# Patient Record
Sex: Female | Born: 1950
Health system: Southern US, Community
[De-identification: ages and names within clinical notes are randomized; demographics above are authoritative.]

## PROBLEM LIST (undated history)

## (undated) DIAGNOSIS — R569 Unspecified convulsions: Secondary | ICD-10-CM

## (undated) DIAGNOSIS — F419 Anxiety disorder, unspecified: Secondary | ICD-10-CM

## (undated) DIAGNOSIS — G709 Myoneural disorder, unspecified: Secondary | ICD-10-CM

## (undated) DIAGNOSIS — I10 Essential (primary) hypertension: Secondary | ICD-10-CM

## (undated) DIAGNOSIS — J45909 Unspecified asthma, uncomplicated: Secondary | ICD-10-CM

## (undated) DIAGNOSIS — M1990 Unspecified osteoarthritis, unspecified site: Secondary | ICD-10-CM

## (undated) DIAGNOSIS — K219 Gastro-esophageal reflux disease without esophagitis: Secondary | ICD-10-CM

## (undated) DIAGNOSIS — F32A Depression, unspecified: Secondary | ICD-10-CM

## (undated) DIAGNOSIS — F329 Major depressive disorder, single episode, unspecified: Secondary | ICD-10-CM

## (undated) DIAGNOSIS — E785 Hyperlipidemia, unspecified: Secondary | ICD-10-CM

## (undated) DIAGNOSIS — T7840XA Allergy, unspecified, initial encounter: Secondary | ICD-10-CM

## (undated) DIAGNOSIS — H269 Unspecified cataract: Secondary | ICD-10-CM

## (undated) HISTORY — DX: Unspecified asthma, uncomplicated: J45.909

## (undated) HISTORY — DX: Unspecified osteoarthritis, unspecified site: M19.90

## (undated) HISTORY — DX: Depression, unspecified: F32.A

## (undated) HISTORY — DX: Major depressive disorder, single episode, unspecified: F32.9

## (undated) HISTORY — DX: Allergy, unspecified, initial encounter: T78.40XA

## (undated) HISTORY — PX: SALPINGOOPHORECTOMY: SHX82

## (undated) HISTORY — DX: Myoneural disorder, unspecified: G70.9

## (undated) HISTORY — DX: Unspecified convulsions: R56.9

## (undated) HISTORY — PX: TUBAL LIGATION: SHX77

## (undated) HISTORY — PX: CYSTECTOMY: SUR359

## (undated) HISTORY — DX: Unspecified cataract: H26.9

## (undated) HISTORY — PX: APPENDECTOMY: SHX54

## (undated) HISTORY — DX: Anxiety disorder, unspecified: F41.9

## (undated) HISTORY — DX: Essential (primary) hypertension: I10

## (undated) HISTORY — DX: Gastro-esophageal reflux disease without esophagitis: K21.9

## (undated) HISTORY — DX: Hyperlipidemia, unspecified: E78.5

---

## 1999-09-12 HISTORY — PX: FOOT SURGERY: SHX648

## 2003-05-27 ENCOUNTER — Ambulatory Visit (HOSPITAL_COMMUNITY): Admission: RE | Admit: 2003-05-27 | Discharge: 2003-05-27 | Payer: Self-pay | Admitting: Internal Medicine

## 2003-05-27 ENCOUNTER — Encounter: Payer: Self-pay | Admitting: Internal Medicine

## 2004-05-26 ENCOUNTER — Ambulatory Visit: Payer: Self-pay | Admitting: Internal Medicine

## 2004-06-03 ENCOUNTER — Ambulatory Visit: Payer: Self-pay | Admitting: Internal Medicine

## 2004-06-10 ENCOUNTER — Ambulatory Visit: Payer: Self-pay | Admitting: Internal Medicine

## 2004-06-16 ENCOUNTER — Ambulatory Visit: Payer: Self-pay | Admitting: *Deleted

## 2004-06-27 ENCOUNTER — Ambulatory Visit: Payer: Self-pay | Admitting: Internal Medicine

## 2004-07-25 ENCOUNTER — Ambulatory Visit: Payer: Self-pay | Admitting: Internal Medicine

## 2004-07-26 ENCOUNTER — Ambulatory Visit: Payer: Self-pay | Admitting: Obstetrics and Gynecology

## 2004-08-29 ENCOUNTER — Ambulatory Visit: Payer: Self-pay | Admitting: Internal Medicine

## 2004-11-29 ENCOUNTER — Ambulatory Visit: Payer: Self-pay | Admitting: Internal Medicine

## 2005-01-18 ENCOUNTER — Emergency Department (HOSPITAL_COMMUNITY): Admission: EM | Admit: 2005-01-18 | Discharge: 2005-01-18 | Payer: Self-pay | Admitting: Emergency Medicine

## 2005-02-01 ENCOUNTER — Inpatient Hospital Stay (HOSPITAL_COMMUNITY): Admission: AD | Admit: 2005-02-01 | Discharge: 2005-02-13 | Payer: Self-pay | Admitting: Psychiatry

## 2005-02-01 ENCOUNTER — Ambulatory Visit: Payer: Self-pay | Admitting: Psychiatry

## 2005-02-24 ENCOUNTER — Emergency Department (HOSPITAL_COMMUNITY): Admission: EM | Admit: 2005-02-24 | Discharge: 2005-02-25 | Payer: Self-pay | Admitting: Emergency Medicine

## 2005-06-07 ENCOUNTER — Encounter (HOSPITAL_BASED_OUTPATIENT_CLINIC_OR_DEPARTMENT_OTHER): Admission: RE | Admit: 2005-06-07 | Discharge: 2005-06-28 | Payer: Self-pay | Admitting: Surgery

## 2005-06-12 ENCOUNTER — Emergency Department (HOSPITAL_COMMUNITY): Admission: EM | Admit: 2005-06-12 | Discharge: 2005-06-12 | Payer: Self-pay | Admitting: Emergency Medicine

## 2009-02-22 ENCOUNTER — Ambulatory Visit (HOSPITAL_COMMUNITY): Admission: RE | Admit: 2009-02-22 | Discharge: 2009-02-22 | Payer: Self-pay | Admitting: Internal Medicine

## 2009-03-17 ENCOUNTER — Ambulatory Visit: Payer: Self-pay | Admitting: Obstetrics and Gynecology

## 2009-03-17 ENCOUNTER — Encounter: Payer: Self-pay | Admitting: Obstetrics and Gynecology

## 2010-06-17 ENCOUNTER — Ambulatory Visit (HOSPITAL_COMMUNITY): Admission: RE | Admit: 2010-06-17 | Discharge: 2010-06-17 | Payer: Self-pay | Admitting: Internal Medicine

## 2011-01-24 NOTE — Group Therapy Note (Signed)
NAME:  REALITY, DEJONGE NO.:  [id number]   MEDICAL RECORD NO.:  [id number]          PATIENT TYPE:  WOC   LOCATION:  [facility]                   FACILITY:  [facility]   PHYSICIAN:  Argentina Donovan, MD        DATE OF BIRTH:  09/30/1950   DATE OF SERVICE:                                  CLINIC NOTE   HISTORY OF PRESENT ILLNESS:  The patient is a 60 year old African  American female gravida 3, para 2-0-1-2 who is a diabetic type 2 on  metformin, Lantus, also takes the gabapentin, Cogentin and Prolixin.  She takes nothing for blood pressure.  However, when seen today, her  blood pressure was 151/97.  She is in for her annual gynecological  examination.   ALLERGIES:  The patient also states that today she took some ibuprofen  for her headache, and she has developed an enormous, large 4 to 5-cm  erythematous urticarial reaction on both lower extremities.  She is  allergic also to ASPIRIN AND CHOCOLATE.   REVIEW OF SYSTEMS:  Her review of systems is fairly negative with the  exception of the above.   PHYSICAL EXAMINATION:  GENERAL:  A well-developed, well-nourished  African American female in no acute distress.  HEENT:  Within normal limits.  VITAL SIGNS:  Blood pressure 151/97, weight 162, height 5 feet, 7 inches  tall, pulse 87 per minute.  NECK:  Supple.  Thyroid symmetrical, no masses.  Neck is erect.  LUNGS:  Clear to auscultation and percussion.  HEART:  No murmur.  Normal sinus rhythm.  BREASTS:  Pendulous, symmetrical, no dominant masses.  No nipple  discharge.  No supraclavicular or axillary nodes, i.e., normal mammogram  in June.  ABDOMEN:  Soft, flat, nontender.  No masses or organomegaly.  EXTREMITIES:  There are large erythematous urticarial lesions over the  entire areas of both legs.  No varicosities.  No edema.  NEUROLOGIC:  DTRs within normal limits.  GENITALIA:  External is normal.  BUS within normal limits.  Vagina is  clean with loss of some rugation.   Cervix is clean, parous.  Uterus is  slightly enlarged and deviated to the right but normal in consistency.  Adnexa could not be palpated because of the habitus of the patient.  ABDOMEN:  Soft, flat, nontender.  No masses or organomegaly.  A large  midline subumbilical vertical scar from previous appendectomy and  ovarian cystectomy and left oophorectomy.   IMPRESSION:  1. Normal gynecological examination.  2. Allergic skin rash.  3. Hypertension.   PLAN:  The plan is to refer the patient back to her internist whom she  has got an appointment with next month, start her on hydrochlorothiazide  25 mg a day and to give her a Medrol Dosepak for her rash.           ______________________________  Argentina Donovan, MD     PR/MEDQ  D:  03/17/2009  T:  03/17/2009  Job:  [id number]

## 2011-01-27 NOTE — Consult Note (Signed)
[name], [name] NO.:  [id number]   MEDICAL RECORD NO.:  [id number]          PATIENT TYPE:  EMS   LOCATION:  [facility]                         FACILITY:  [facility]   PHYSICIAN:  Harold A. Tanda Rockers, M.D.DATE OF BIRTH:  20-Jan-1951   DATE OF CONSULTATION:  06/19/2005  DATE OF DISCHARGE:  06/12/2005                                   CONSULTATION   REASON FOR CONSULTATION:  [name] is a 60 year old lady referred by Dr.  Fleet Contras for evaluation of multiple recurrent right arm and shoulder  wounds.   IMPRESSION:  Resolved cellulitis during the interim of this referral from  Dr. Concepcion Elk. Most likely cellulitis responded to the patient's p.o. Keflex.   SUBJECTIVE:  [name] is a 60 year old lady who has recently relocated  to this area from [place]. She has the previous medical history of  diabetes as well as schizophrenia for which she was on medication. When  initially seen by Dr. Concepcion Elk, her medications included Cogentin 1 mg daily,  Prolixin 20 mg injection once a week. Her random blood glucose was 99 mg  percent with a hemoglobin A1c of 6.9. Her physical exam during her initial  visit disclosed chronic rash and bumps over the right shoulder of  questionable etiology. The patient was started on Keflex and was referred to  the wound center.   PAST MEDICAL HISTORY:  As per above. She specifically denies previous  surgical procedures.   FAMILY HISTORY:  Positive for high blood pressure, diabetes, and heart  attack.   SOCIAL HISTORY:  She is single. She does smoke a pack and half of cigarettes  a day. She has been on multiple hospital admissions of a psychiatric nature.  We did not go into extreme detail except to establish the fact that she had  been under psychiatric care in the past.   PHYSICAL EXAMINATION:  Physical exam which was chaperoned completely by  Darla Lesches,  R.N., was as follows:  HEENT:  Exam was clear.  NECK:  Supple. Tracheal  midline.  ABDOMEN:  Soft without aneurysmal dilatation.  EXTREMITIES:  Pedal pulses were 3+ bilaterally. There were no ulcerations  demonstrable.  SKIN:  There were multiple excoriated areas on the right shoulder which were  completely healed. There were hypopigmented areas as well as some  hyperpigmented areas but there were no open wounds and no drainage. These  wounds were uniquely located over the right shoulder. There were no such  wounds on the back or the posterior thighs or lower extremities. There was  no associated adenopathy.   DISCUSSION:  The patient was advised that at this point, she has apparently  responded to the p.o. antibiotics that Dr. Concepcion Elk prescribed for her at the  time that this consultation was made. During the interim, she has responded  and we are happy to conclude that the presumed cellulitis has completely  resolved. We have encouraged her to keep all of her appointments with Dr.  Concepcion Elk. In addition, she is to keep her appointments with her assigned  psychiatrist. We have not given her a reappointment for  the wound center,  but we have expressed a willingness to see her in the event that she has  open wounds. The patient expresses gratitude for having been seen in the  wound center and expresses a desire to comply with our recommendations.           ______________________________  Theresia Majors Tanda Rockers, M.D.     Cephus Slater  D:  06/19/2005  T:  06/19/2005  Job:  [id number]   cc:   Fleet Contras, M.D.  Fax: [phone]

## 2011-01-27 NOTE — Consult Note (Signed)
NAMENADEAN, MONTANARO NO.:  [id number]   MEDICAL RECORD NO.:  [id number]          PATIENT TYPE:  IPS   LOCATION:  0406                          [facility]   PHYSICIAN:  Brooke Quarry, MD      DATE OF BIRTH:  19-Nov-1950   DATE OF CONSULTATION:  DATE OF DISCHARGE:                                   CONSULTATION   IDENTIFYING INFORMATION:  [name] is a 60 year old lady who was  admitted to [facility] on Feb 02, 2005 at which time apparently she  was committed because of delusional thinking, hearing voices and hostile  behavior.  I gather that she is thought to have schizoaffective disorder.  It is essentially impossible at this time to get any medical history from  [name].  She states that her medical history is a secret that she keeps  hidden and does not discuss with anyone including her family.  She says  that, in the past, doctors have tried to get information from her but she  will not give it to them because it is a Event organiser.  She says that she has had  no medical problems in the past and that no one has ever found her blood  sugar to be abnormal previously.  I do not believe that there has been any  previous admissions at this facility.  I note that the patient was seen in  the emergency room on May 10th but I do not believe that any laboratory  studies were done at that time.  The patient denies any recent history of  weight loss, excessive thirst, urinary frequency, paresthesias or numbness  of the feet or visual impairment.   PAST MEDICAL HISTORY:  The patient has had some type of a biopsy procedure  on her chest.  Perhaps, this was for an abscess.  She says that she cannot  discuss this.  She denies having any other operations.  She denies any other  hospitalizations.   FAMILY HISTORY:  She says that her family's history is a Event organiser.   SOCIAL HISTORY:  Cannot be obtained at this time.   REVIEW OF SYSTEMS:  Fairly fruitless.  She  indicates that she is not having  any headache, breathing difficulty or chest pain.  I was not able to really  obtain any other information from her.   PHYSICAL EXAMINATION:  HEENT/NECK:  This was also rather sketchy but her  head and neck exam appears to be normal.  CHEST:  On examination of the chest, there is a scar on the anterior portion  of the chest that may be from a previous incision and drainage procedure.  The chest is clear.  CARDIOVASCULAR:  Normal S1 and S2 without rubs, murmurs or gallops.  ABDOMEN:  The patient will not allow me to examine her abdomen.  EXTREMITIES:  We examined her extremities together.  She has no cyanosis or  edema.   IMPRESSION:  1.  Inadequate database.  2.  Uncontrolled diabetes.  CBG shows blood sugars ranging between 170 and      305.  3.  Possible incision and drainage or skin biopsy procedure.  4.  Schizoaffective disorder.   PLAN:  1.  I would continue to monitor the patient's blood sugars a.c. and h.s.  2.  I will order a sliding scale insulin regimen.  3.  In the morning, I think it would be a good idea to start the patient on      an oral agent.  I would suggest that Glucophage would be a good choice      as it should be very safe.  It is hard to say whether the patient will      be compliant with any type of regimen as an outpatient.  4.  We will follow this patient in the hospital setting.      SY/MEDQ  D:  02/09/2005  T:  02/09/2005  Job:  [id number]

## 2011-01-27 NOTE — Discharge Summary (Signed)
[name], [name] NO.:  [id number]   MEDICAL RECORD NO.:  [id number]          PATIENT TYPE:  IPS   LOCATION:  0406                          [facility]ACILITY[facility]   PHYSICIAN:  Geoffery Lyons, M.D.      DATE OF BIRTH:  1951/01/27   DATE OF ADMISSION:  02/01/2005  DATE OF DISCHARGE:  02/13/2005                                 DISCHARGE SUMMARY   IDENTIFYING DATA:  The patient is here on petition.  The papers state that  the patient is delusional, hearing command voices.  The patient presents and  was hostile and defensive, preoccupied with the idea of bathing herself with  Lysol and Clorox and states also she tried to wash her grandchildren as  well.  The patient denies any psychotic symptoms or significant stressors  and denied any suicidal or homicidal thoughts.   PAST PSYCHIATRIC HISTORY:  First admission to the [facility],  has apparently seen Dr. Hortencia Pilar in the past.   SOCIAL HISTORY:  The patient is a 60 year old African-American female,  marital status is unknown.  The patient apparently lives alone but again  that is uncertain as the patient is not providing much history.  The patient  apparently is disabled.   FAMILY HISTORY:  Unclear.   ALCOHOL AND DRUG HISTORY:  Denies any alcohol or drug use.   PRIMARY CARE [name]:  The patient reports that it is confidential..   MEDICAL PROBLEMS:  Recent urinary tract infection.   MEDICATIONS:  1.  Bactrim 1 p.o. b.i.d. for three days.  2.  Apparently was on valproic acid 250 mg taking two b.i.d.   DRUG ALLERGIES:  ASPIRIN.   PHYSICAL EXAMINATION:  The patient appears in no acute distress.  VITALS:  Stable at 98.6, 73 heart rate, blood pressure is elevated, however,  at 186/100.  GENERAL:  She is a well-nourished female and in no acute distress.  EXTREMITIES:  No clubbing or edema.  The patient walks upright without any  tremors and at this time is not allowing any physical exam due to her  guarded and paranoid behavior.  This physical exam was attempted twice.   LABORATORY DATA:  Hemoglobin A1C 6.3, glucose 146, BUN 4, TSH 0.595.   MENTAL STATUS EXAM:  The patient is in the day room sitting in a chair.  Again, not wanting to talk, answered a few questions, little eye contact.  Speech is clear with normal pace and tone.  The patient states she feels  just fine today.  The patient is irritable.  Thought processes are  delusional and paranoid.  Does not appear to be internally distracted by  internal stimuli.  Cognitive function intact.  Seems aware of her  surroundings and herself.  Her memory is poor.  She is a poor historian.  Judgment and insight is poor.   DIAGNOSES:  Axis I.  Rule out schizoaffective disorder.  Axis II.  Deferred.  Axis III.  Hypertension.  Axis IV.  Other psychosocial problems, possible medical problems.  Axis V.  Current is 25, estimated past year 60.   PLAN:  Admit and monitor for psychotic symptoms.  Contract for safety.  Stabilize mood and thinking.  We will monitor the patient's vital signs  closely as well as her elevated blood sugar.  We will initiate Risperdal for  paranoid ideation.  Contact family for background information.  The patient  is to be medication compliant.  Tentative length of stay is 5-7 days.       JO/MEDQ  D:  02/14/2005  T:  02/14/2005  Job:  [id number]

## 2011-01-27 NOTE — Discharge Summary (Signed)
[name], [name] NO.:  [id number]   MEDICAL RECORD NO.:  [id number]          PATIENT TYPE:  IPS   LOCATION:  0406                          [facility]ILITY[facility]   PHYSICIAN:  [name], M.D.      DATE OF BIRTH:  10-27-1950   DATE OF ADMISSION:  02/01/2005  DATE OF DISCHARGE:  02/13/2005                                 DISCHARGE SUMMARY   CHIEF COMPLAINT AND PRESENT ILLNESS:  This was the first admission to [facility] for this 60 year old female admitted involuntarily.  She was delusional, hearing command voices, presented as hostile, defensive,  preoccupied with the idea of bathing herself with Lysol and Clorox.  Stated  that she tried to wash her grandchildren as well.  Denied any psychotic  symptoms or significant stressors.  Denied any suicidal or homicidal  thoughts.   PAST PSYCHIATRIC HISTORY:  First time at [facility].  Has seen Dr.  Hortencia Morrison in the past.   ALCOHOL/DRUG HISTORY:  Denies the use or abuse of any substances.   PAST MEDICAL HISTORY:  Recent urinary tract infection.   MEDICATIONS:  Bactrim 1 twice a day for three days.  Was on valproic acid  250 mg, 2 twice a day.  Noncompliant.   PHYSICAL EXAMINATION:  Performed and failed to show any acute findings.   LABORATORY DATA:  CBC within normal limits.  Blood chemistry with glucose  146, hemoglobin A1C 6.3. Lipid profile with cholesterol 157.  TSH 0.595.   MENTAL STATUS EXAM:  Female who was not wanting to talk, answer a few  questions, little eye contact.  Speech was clear, normal pace and tone.  Claimed that she felt just fine, irritable, still delusional ideas, some  paranoia, very reserved, very guarded, suspicious.  Cognition affected by  the acute psychotic process.   ADMISSION DIAGNOSES:   AXIS I:  Schizoaffective disorder.   AXIS II:  No diagnosis.   AXIS III:  1.  Hypertension.  2.  Rule out diabetes mellitus.   AXIS IV:  Moderate.   AXIS V:   Global Assessment of Functioning upon admission 25; highest Global  Assessment of Functioning in the last year 60.   HOSPITAL COURSE:  She was admitted.  She was started in individual and group  psychotherapy.  She was seen by internal medicine.  She was very resistant  to share any of her history with the internal medicine physician.  Denied  any medical problems.  Blood sugars were monitored.  She was placed on  Toprol XL 50 mg per day, Ativan 2 mg IM every six hours as needed for  agitation as well as Haldol 5 mg IM every six hours as needed for agitation.  She was eventually placed on Risperdal 0.25 mg in the morning, 0.5 mg at  night that she was not taking.  Risperdal was switched to Risperdal M-Tab  0.5 mg in the morning and 1 mg at night.  She continued to claim that she  did not need to be in the hospital.  Claimed that none of the allegations  were true.  Claimed that she was not taking medication because she did not  need them.  Claimed no hallucinations.  Claimed her mood was okay.  Claimed  that she was sleeping well.  Would not validate any of the information on  the petition.  Mood was upset because she was in the unit.  Affect was  constricted, suspicious, denied any of the symptoms.  We continued to work  with her.  Continued to refuse medications.  Claimed that she has been  healthy all along and she has not needed medication in the past and did not  need medications now.  She was somewhat irritable when pressured.  Continued  to refuse medication and stating that she did not need to be in the unit.  Second opinion was obtained and justification was there to force  medications.  She had been wearing __________.  She was not sleeping.  She  was cleaning tables with peanut butter.  Had gotten more agitated, so we  went ahead and forced the Risperdal Consta.  By June 2nd, she had started to  accept some medication by mouth.  She seemed to be more at ease, less  suspicious,  more compliant.  She was indeed more pleasant.  We continued to  stabilize and, on June 5th, the commitment expired.  There was no enough  evidence to keep her inpatient.  She had gotten the Risperdal Consta, was  accepting the Risperdal by mouth.  Objectively, she was better, so we went  ahead and discharged to outpatient follow-up.   DISCHARGE DIAGNOSES:   AXIS I:  Schizoaffective disorder.   AXIS II:  No diagnosis.   AXIS III:  1.  Diabetes mellitus.  2.  Arterial hypertension.   AXIS IV:  Moderate.   AXIS V:  Global Assessment of Functioning upon discharge 45-50.   DISCHARGE MEDICATIONS:  1.  Risperdal Consta 25 mg IM; next dose February 23, 2005.  2.  Risperdal 0.5 mg tab in the morning and 2 at bedtime.  3.  Toprol-XL 50 mg daily.  4.  Metformin 500 mg, 1 twice a day.  5.  Zocor 20 mg at night.  6.  Glucotrol 5 mg in the morning.   FOLLOW UP:  Dr. Lang Morrison at [facility].     IL/MEDQ  D:  03/09/2005  T:  03/10/2005  Job:  [id number]

## 2011-01-27 NOTE — Group Therapy Note (Signed)
NAME:  [name], [name] NO.:  [id number]   MEDICAL RECORD NO.:  [id number]          PATIENT TYPE:  WOC   LOCATION:  [facility]                   FACILITY:  [facility]   PHYSICIAN:  Argentina Donovan, MD        DATE OF BIRTH:  1951/01/08   DATE OF SERVICE:  07/26/2004                                    CLINIC NOTE   REASON FOR VISIT:  The patient is a 60 year old black female gravida 3, para  2-0-1-2 three years postmenopausal who is followed by the medical clinic and  is on many medications for hypertension, asthma, and diabetes.  She has a  long list of medications on the chart, is otherwise in good health.  She  takes medications regularly and is in for an annual Pap smear.  We have  discussed with her that if she has always had normal Pap smears that she  would probably only need one every 3 years at this point.   PHYSICAL EXAMINATION:  PELVIC:  External genitalia is normal.  BUS within  normal limits.  Vagina is clean and well rugated.  Cervix is clean and  parous.  The uterus and adnexa were not well outlined because of the habitus  of the patient, 224 pounds.  Pap smear was taken.   IMPRESSION:  Normal gynecological exam.      PR/MEDQ  D:  07/26/2004  T:  07/26/2004  Job:  [id number]

## 2011-05-23 ENCOUNTER — Other Ambulatory Visit (HOSPITAL_COMMUNITY): Payer: Self-pay | Admitting: Internal Medicine

## 2011-05-23 DIAGNOSIS — Z1231 Encounter for screening mammogram for malignant neoplasm of breast: Secondary | ICD-10-CM

## 2011-07-31 ENCOUNTER — Ambulatory Visit (HOSPITAL_COMMUNITY): Payer: Self-pay

## 2011-08-09 ENCOUNTER — Ambulatory Visit (HOSPITAL_COMMUNITY)
Admission: RE | Admit: 2011-08-09 | Discharge: 2011-08-09 | Disposition: A | Discharge status: Home / Self Care | Payer: Medicare PPO | Source: Ambulatory Visit | Attending: Internal Medicine | Admitting: Internal Medicine

## 2011-08-09 DIAGNOSIS — Z1231 Encounter for screening mammogram for malignant neoplasm of breast: Secondary | ICD-10-CM | POA: Insufficient documentation

## 2012-05-03 ENCOUNTER — Ambulatory Visit (INDEPENDENT_AMBULATORY_CARE_PROVIDER_SITE_OTHER): Payer: Medicare Other | Admitting: Obstetrics & Gynecology

## 2012-05-03 ENCOUNTER — Other Ambulatory Visit (HOSPITAL_COMMUNITY)
Admission: RE | Admit: 2012-05-03 | Discharge: 2012-05-03 | Disposition: A | Discharge status: Home / Self Care | Payer: Medicare Other | Source: Ambulatory Visit | Attending: Obstetrics & Gynecology | Admitting: Obstetrics & Gynecology

## 2012-05-03 ENCOUNTER — Encounter: Payer: Self-pay | Admitting: Obstetrics & Gynecology

## 2012-05-03 VITALS — BP 111/82 | HR 85 | Temp 98.1°F | Resp 12 | Ht 67.0 in | Wt 189.8 lb

## 2012-05-03 DIAGNOSIS — G8929 Other chronic pain: Secondary | ICD-10-CM

## 2012-05-03 DIAGNOSIS — M129 Arthropathy, unspecified: Secondary | ICD-10-CM

## 2012-05-03 DIAGNOSIS — Z124 Encounter for screening for malignant neoplasm of cervix: Secondary | ICD-10-CM | POA: Insufficient documentation

## 2012-05-03 DIAGNOSIS — Z01419 Encounter for gynecological examination (general) (routine) without abnormal findings: Secondary | ICD-10-CM

## 2012-05-03 DIAGNOSIS — Z1151 Encounter for screening for human papillomavirus (HPV): Secondary | ICD-10-CM | POA: Insufficient documentation

## 2012-05-03 DIAGNOSIS — M1990 Unspecified osteoarthritis, unspecified site: Secondary | ICD-10-CM | POA: Insufficient documentation

## 2012-05-03 DIAGNOSIS — F419 Anxiety disorder, unspecified: Secondary | ICD-10-CM | POA: Insufficient documentation

## 2012-05-03 DIAGNOSIS — E119 Type 2 diabetes mellitus without complications: Secondary | ICD-10-CM

## 2012-05-03 DIAGNOSIS — F411 Generalized anxiety disorder: Secondary | ICD-10-CM

## 2012-05-03 DIAGNOSIS — I10 Essential (primary) hypertension: Secondary | ICD-10-CM

## 2012-05-03 NOTE — Progress Notes (Signed)
Patient ID: [name], female   DOB: 1951-05-25, 61 y.o.   MRN: [id number]  Chief Complaint  Patient presents with  . Gynecologic Exam    HPI [id number] No LMP recorded. Patient is postmenopausal.  [name] is a 61 y.o. female.  Referred for pap smear by [facility] in [facility]. No history of abnormal pap, last was done 2010. HPI  Past Medical History  Diagnosis Date  . Asthma   . Hypertension   . Arthritis   . Diabetes mellitus     type II  . Allergy   . Depression   . Hyperlipidemia   . Seizures     Past Surgical History  Procedure Date  . Foot surgery   . Salpingoophorectomy   . Tubal ligation   . Cystectomy   . Appendectomy     Family History  Problem Relation Age of Onset  . Heart disease Mother   . Diabetes Mother   . Depression Mother   . Cancer Father   . Diabetes Father   . Depression Father   . Diabetes Sister   . Kidney disease Brother   . Diabetes Brother   . Kidney disease Brother   . Diabetes Brother   . Diabetes Brother   . Diabetes Brother     Social History History  Substance Use Topics  . Smoking status: Current Everyday Smoker -- 0.5 packs/day  . Smokeless tobacco: Never Used  . Alcohol Use: No    Allergies  Allergen Reactions  . Aspirin Anaphylaxis    Swelling in mouth, throat and hives    Current Outpatient Prescriptions  Medication Sig Dispense Refill  . amLODipine (NORVASC) 5 MG tablet Take 5 mg by mouth daily.      . benztropine (COGENTIN) 0.5 MG tablet Take 0.5 mg by mouth daily.      . diazepam (VALIUM) 5 MG tablet Take 5 mg by mouth every 12 (twelve) hours as needed.      . fenofibrate (TRICOR) 48 MG tablet Take 48 mg by mouth daily.      Marland Kitchen gabapentin (NEURONTIN) 300 MG capsule Take 300 mg by mouth 3 (three) times daily.      . insulin glargine (LANTUS) 100 UNIT/ML injection Inject 35 Units into the skin at bedtime.      Marland Kitchen lisinopril (PRINIVIL,ZESTRIL) 10 MG tablet Take 10 mg by mouth daily.      Marland Kitchen  loratadine (CLARITIN) 10 MG tablet Take 10 mg by mouth daily.      . metFORMIN (GLUCOPHAGE) 500 MG tablet Take 500 mg by mouth 2 (two) times daily with a meal.      . polyethylene glycol powder (GLYCOLAX/MIRALAX) powder Take 17 g by mouth daily.      . fluPHENAZine decanoate (PROLIXIN) 25 MG/ML injection       . lisinopril-hydrochlorothiazide (PRINZIDE,ZESTORETIC) 20-25 MG per tablet       . nystatin (MYCOSTATIN) 100000 UNIT/ML suspension       . PROAIR HFA 108 (90 BASE) MCG/ACT inhaler       . promethazine-codeine (PHENERGAN WITH CODEINE) 6.25-10 MG/5ML syrup         Review of Systems Review of Systems  Constitutional: Negative.   Gastrointestinal: Negative.   Genitourinary: Negative for vaginal discharge, menstrual problem and pelvic pain.    Blood pressure 111/82, pulse 85, temperature 98.1 F (36.7 C), temperature source Oral, resp. rate 12, height 5\' 7"  (1.702 m), weight 189 lb 12.8 oz (86.093 kg).  Physical Exam  Physical Exam  Constitutional: She appears well-developed. No distress.  Pulmonary/Chest: Effort normal.  Abdominal: Soft. She exhibits no distension and no mass.  Genitourinary: Vagina normal and uterus normal. No vaginal discharge found.       No masses or tenderness   Pap smear done Data Reviewed Referral notes  Assessment    Routine GYN exam for pap smear. Mammography and breast exams through her PCP. Co-testing for HPV done, if normal, consider D/C future paps.    Plan    Check result.       ARNOLD,JAMES 05/03/2012, 12:52 PM

## 2012-05-03 NOTE — Patient Instructions (Addendum)
Pap Test A Pap test is a sampling of cells from a woman's cervix. The cervix is the opening between the vagina (birth canal) and the uterus (the bottom part of the womb). The cells are scraped from the cervix during a pelvic exam. These cells are then looked at under a microscope to see if the cells are normal or to see if a cancer is developing or there are changes that suggest a cancer will develop. Cervical dysplasia is a condition in which a woman has abnormal changes in the top layer of cells of her cervix. These changes are an early sign that cervical cancer may develop. Pap tests also look for the human papilloma virus (HPV) because it has 4 types that are responsible for 70% of cervical cancer. Infections can also be found during a Pap test such as bacteria, fungus, protozoa and viruses.  Cervical cancer is harder to treat and less likely to have a good outcome if left untreated. Catching the disease at an early stage leads to a better outcome. Since the Pap test was introduced 60 years ago, deaths from cervical cancer have decreased by 70%. Every woman should keep up to date with Pap tests. RISK FACTORS FOR CERVICAL CANCER INCLUDE:   Becoming sexually active before age 18.   Being the daughter of a woman who took diethylstilbestrol (DES) during pregnancy.   Having a sexual partner who has or has had cancer of the penis.   Having a sexual partner whose past partner had cervical cancer or cervical dysplasia (early cell changes which suggest a cancer may develop).   Having a weakened immune system. An example would be HIV or other immunodeficiency disorder.   Having had a sexually transmitted infection such as chlamydia, gonorrhea or HPV.   Having had an abnormal Pap or cancer of the vagina or vulva.   Having had more than one sexual partner.   A history of cervical cancer in a woman's sister or mother.   Not using condoms with new sexual partners.   Smoking.  WHO SHOULD HAVE PAP  TESTS  A Pap test is done to screen for cervical cancer.   The first Pap test should be done at age 21.   Between ages 21 and 29, Pap tests are repeated every 2 years.   Beginning at age 30, you are advised to have a Pap test every 3 years as long as your past 3 Pap tests have been normal.   Some women have medical problems that increase the chance of getting cervical cancer. Talk to your caregiver about these problems. It is especially important to talk to your caregiver if a new problem develops soon after your last Pap test. In these cases, your caregiver may recommend more frequent screening and Pap tests.   The above recommendations are the same for women who have or have not gotten the vaccine for HPV (Human Papillomavirus).   If you had a hysterectomy for a problem that was not a cancer or a condition that could lead to cancer, then you no longer need Pap tests. However, even if you no longer need a Pap test, a regular exam is a good idea to make sure no other problems are starting.    If you are between ages 65 and 70, and you have had normal Pap tests going back 10 years, you no longer need Pap tests. However, even if you no longer need a Pap test, a regular exam is a good idea   to make sure no other problems are starting.    If you have had past treatment for cervical cancer or a condition that could lead to cancer, you need Pap tests and screening for cancer for at least 20 years after your treatment.   If Pap tests have been discontinued, risk factors (such as a new sexual partner) need to be re-assessed to determine if screening should be resumed.   Some women may need screenings more often if they are at high risk for cervical cancer.  PREPARATION FOR A PAP TEST A Pap test should be performed during the weeks before the start of menstruation. Women should not douche or have sexual intercourse for 24 hours before the test. No vaginal creams, diaphragms, or tampons should be  used for 24 hours before the test. To minimize discomfort, a woman should empty her bladder just before the exam. TAKING THE PAP TEST The caregiver will perform a pelvic exam. A metal or plastic instrument (speculum) is placed in the vagina. This is done before your caregiver does a bimanual exam of your internal female organs. This instrument allows your caregiver to see the inside of the vagina and look at the cervix. A small, sterile brush is used to take a sample of cells from the internal opening of the cervix. A small wooden spatula is used to scrape the outside of the cervix. Neither of these two methods to collect cells will cause you pain. These two scrapings are placed on a glass slide or in a small bottle filled with a special liquid. The cells are looked at later under a microscope in a lab. A specialist will look at these cells and determine if the cells are normal. RESULTS OF YOUR PAP TEST  A healthy Pap test shows no abnormal cells or evidence of inflammation.   The presence of abnormally growing cells on the surface of the cervix may be reported as an abnormal Pap test. Different categories of findings are used to describe your Pap test. Your caregiver will go over the importance of these findings with you. The caregiver will then determine what follow-up is needed or when you should have your next pap test.   If you have had two or more abnormal Pap tests:   You may be asked to have a colposcopy. This is a test in which the cervix is viewed with a special lighted microscope.   A cervical tissue sample (biopsy) may also be needed. This involves taking a small tissue sample from the cervix. The sample is looked at under a microscope to find the cause of the abnormal cells. Make sure you find out the results of the Pap test. If you have not received the results within two weeks, contact your caregiver's office for the results. Do not assume everything is normal if you have not heard from  your caregiver or medical facility. It is important to follow up on all of your test results.  Document Released: 11/18/2002 Document Revised: 08/17/2011 Document Reviewed: 08/22/2011 [employer] Patient Information 2012 [employer]. 

## 2012-07-03 ENCOUNTER — Other Ambulatory Visit (HOSPITAL_COMMUNITY): Payer: Self-pay | Admitting: Specialist

## 2012-07-03 DIAGNOSIS — Z1231 Encounter for screening mammogram for malignant neoplasm of breast: Secondary | ICD-10-CM

## 2012-08-12 ENCOUNTER — Ambulatory Visit (HOSPITAL_COMMUNITY): Payer: Medicare Other

## 2012-08-14 ENCOUNTER — Ambulatory Visit (HOSPITAL_COMMUNITY)
Admission: RE | Admit: 2012-08-14 | Discharge: 2012-08-14 | Disposition: A | Discharge status: Home / Self Care | Payer: Medicare PPO | Source: Ambulatory Visit | Attending: Specialist | Admitting: Specialist

## 2012-08-14 DIAGNOSIS — Z1231 Encounter for screening mammogram for malignant neoplasm of breast: Secondary | ICD-10-CM | POA: Insufficient documentation

## 2013-07-18 ENCOUNTER — Ambulatory Visit (INDEPENDENT_AMBULATORY_CARE_PROVIDER_SITE_OTHER): Payer: Medicare PPO

## 2013-07-18 VITALS — BP 148/91 | HR 81 | Resp 18 | Ht 67.0 in | Wt 192.0 lb

## 2013-07-18 DIAGNOSIS — E1149 Type 2 diabetes mellitus with other diabetic neurological complication: Secondary | ICD-10-CM

## 2013-07-18 DIAGNOSIS — E1140 Type 2 diabetes mellitus with diabetic neuropathy, unspecified: Secondary | ICD-10-CM

## 2013-07-18 DIAGNOSIS — Q828 Other specified congenital malformations of skin: Secondary | ICD-10-CM

## 2013-07-18 DIAGNOSIS — M2040 Other hammer toe(s) (acquired), unspecified foot: Secondary | ICD-10-CM

## 2013-07-18 DIAGNOSIS — M79609 Pain in unspecified limb: Secondary | ICD-10-CM

## 2013-07-18 DIAGNOSIS — E1142 Type 2 diabetes mellitus with diabetic polyneuropathy: Secondary | ICD-10-CM

## 2013-07-18 NOTE — Patient Instructions (Signed)
Diabetes and Foot Care Diabetes may cause you to have problems because of poor blood supply (circulation) to your feet and legs. This may cause the skin on your feet to become thinner, break easier, and heal more slowly. Your skin may become dry, and the skin may peel and crack. You may also have nerve damage in your legs and feet causing decreased feeling in them. You may not notice minor injuries to your feet that could lead to infections or more serious problems. Taking care of your feet is one of the most important things you can do for yourself.  HOME CARE INSTRUCTIONS  Wear shoes at all times, even in the house. Do not go barefoot. Bare feet are easily injured.  Check your feet daily for blisters, cuts, and redness. If you cannot see the bottom of your feet, use a mirror or ask someone for help.  Wash your feet with warm water (do not use hot water) and mild soap. Then pat your feet and the areas between your toes until they are completely dry. Do not soak your feet as this can dry your skin.  Apply a moisturizing lotion or petroleum jelly (that does not contain alcohol and is unscented) to the skin on your feet and to dry, brittle toenails. Do not apply lotion between your toes.  Trim your toenails straight across. Do not dig under them or around the cuticle. File the edges of your nails with an emery board or nail file.  Do not cut corns or calluses or try to remove them with medicine.  Wear clean socks or stockings every day. Make sure they are not too tight. Do not wear knee-high stockings since they may decrease blood flow to your legs.  Wear shoes that fit properly and have enough cushioning. To break in new shoes, wear them for just a few hours a day. This prevents you from injuring your feet. Always look in your shoes before you put them on to be sure there are no objects inside.  Do not cross your legs. This may decrease the blood flow to your feet.  If you find a minor scrape,  cut, or break in the skin on your feet, keep it and the skin around it clean and dry. These areas may be cleansed with mild soap and water. Do not cleanse the area with peroxide, alcohol, or iodine.  When you remove an adhesive bandage, be sure not to damage the skin around it.  If you have a wound, look at it several times a day to make sure it is healing.  Do not use heating pads or hot water bottles. They may burn your skin. If you have lost feeling in your feet or legs, you may not know it is happening until it is too late.  Make sure your health care provider performs a complete foot exam at least annually or more often if you have foot problems. Report any cuts, sores, or bruises to your health care provider immediately. SEEK MEDICAL CARE IF:   You have an injury that is not healing.  You have cuts or breaks in the skin.  You have an ingrown nail.  You notice redness on your legs or feet.  You feel burning or tingling in your legs or feet.  You have pain or cramps in your legs and feet.  Your legs or feet are numb.  Your feet always feel cold. SEEK IMMEDIATE MEDICAL CARE IF:   There is increasing redness,   swelling, or pain in or around a wound.  There is a red line that goes up your leg.  Pus is coming from a wound.  You develop a fever or as directed by your health care provider.  You notice a bad smell coming from an ulcer or wound. Document Released: 08/25/2000 Document Revised: 04/30/2013 Document Reviewed: 02/04/2013 [employer] Patient Information 2014 [employer].  

## 2013-07-18 NOTE — Progress Notes (Signed)
  Subjective:    Patient ID: [name], female    DOB: June 07, 1951, 62 y.o.   MRN: [id number]  HPIN Diabetic shoe pick up No changes medication her health history since last seen. Patient indicates she is unsteady with her walking and fell injuring her left knee recently, has a slight scab on her knee.          Review of Systems  Respiratory: Positive for wheezing.   Cardiovascular: Negative.   Gastrointestinal: Negative.   Endocrine: Negative.   Genitourinary: Negative.   Musculoskeletal: Negative.   Skin: Positive for wound.       Scabbing to arms and left knee  Allergic/Immunologic: Positive for food allergies.       Chocolate  Psychiatric/Behavioral: Negative.        Objective:   Physical Exam Neurovascular status unchanged pedal pulses DP plus one over 4 PT thready nonpalpable bilateral. Epicritic and proprioceptive sensations grossly diminished on Semmes Weinstein testing. Patient also complaining of pain of her ankle and leg however this will be evaluated within the next month during followup visit. At this time one pair of shoes and 3 pairs of dual density Plastizote inlays which fit and contour well to the patient's foot and arch are dispensed. The shoes for adequate room for toes patient does have digital contractures multiple keratoses which are identified. Patient will recheck in one month for followup she check and palliative diabetic foot and nail care.       Assessment & Plan:  Diabetes with peripheral neuropathy. 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 Dispensed diabetic shoes and 3 pairs of dual density Plastizote inlays. Dispensed oral and written instructions for shoe wear and break-in period plan for followup with in one month.  Alvan Dame DPM

## 2013-07-26 ENCOUNTER — Emergency Department (HOSPITAL_COMMUNITY)
Admission: EM | Admit: 2013-07-26 | Discharge: 2013-07-26 | Disposition: A | Discharge status: Home / Self Care | Payer: Medicare PPO | Attending: Emergency Medicine | Admitting: Emergency Medicine

## 2013-07-26 ENCOUNTER — Encounter (HOSPITAL_COMMUNITY): Payer: Self-pay | Admitting: Emergency Medicine

## 2013-07-26 DIAGNOSIS — E785 Hyperlipidemia, unspecified: Secondary | ICD-10-CM | POA: Insufficient documentation

## 2013-07-26 DIAGNOSIS — Z794 Long term (current) use of insulin: Secondary | ICD-10-CM | POA: Insufficient documentation

## 2013-07-26 DIAGNOSIS — F3289 Other specified depressive episodes: Secondary | ICD-10-CM | POA: Insufficient documentation

## 2013-07-26 DIAGNOSIS — F329 Major depressive disorder, single episode, unspecified: Secondary | ICD-10-CM | POA: Insufficient documentation

## 2013-07-26 DIAGNOSIS — R739 Hyperglycemia, unspecified: Secondary | ICD-10-CM

## 2013-07-26 DIAGNOSIS — E119 Type 2 diabetes mellitus without complications: Secondary | ICD-10-CM | POA: Insufficient documentation

## 2013-07-26 DIAGNOSIS — F17200 Nicotine dependence, unspecified, uncomplicated: Secondary | ICD-10-CM | POA: Insufficient documentation

## 2013-07-26 DIAGNOSIS — G40909 Epilepsy, unspecified, not intractable, without status epilepticus: Secondary | ICD-10-CM | POA: Insufficient documentation

## 2013-07-26 DIAGNOSIS — Z8739 Personal history of other diseases of the musculoskeletal system and connective tissue: Secondary | ICD-10-CM | POA: Insufficient documentation

## 2013-07-26 DIAGNOSIS — I10 Essential (primary) hypertension: Secondary | ICD-10-CM | POA: Insufficient documentation

## 2013-07-26 DIAGNOSIS — J45909 Unspecified asthma, uncomplicated: Secondary | ICD-10-CM | POA: Insufficient documentation

## 2013-07-26 DIAGNOSIS — Z79899 Other long term (current) drug therapy: Secondary | ICD-10-CM | POA: Insufficient documentation

## 2013-07-26 LAB — URINALYSIS, ROUTINE W REFLEX MICROSCOPIC
Glucose, UA: 500 mg/dL — AB
Hgb urine dipstick: NEGATIVE
Leukocytes, UA: NEGATIVE
Urobilinogen, UA: 0.2 mg/dL (ref 0.0–1.0)
pH: 6 (ref 5.0–8.0)

## 2013-07-26 LAB — CBC WITH DIFFERENTIAL/PLATELET
Basophils Absolute: 0.1 10*3/uL (ref 0.0–0.1)
Basophils Relative: 0 % (ref 0–1)
Eosinophils Absolute: 0.4 10*3/uL (ref 0.0–0.7)
Eosinophils Relative: 3 % (ref 0–5)
HCT: 42.3 % (ref 36.0–46.0)
Hemoglobin: 15.4 g/dL — ABNORMAL HIGH (ref 12.0–15.0)
MCH: 31.4 pg (ref 26.0–34.0)
MCHC: 36.4 g/dL — ABNORMAL HIGH (ref 30.0–36.0)
MCV: 86.3 fL (ref 78.0–100.0)
Monocytes Absolute: 0.7 10*3/uL (ref 0.1–1.0)
Monocytes Relative: 6 % (ref 3–12)
Neutrophils Relative %: 50 % (ref 43–77)
Platelets: 293 10*3/uL (ref 150–400)
RBC: 4.9 MIL/uL (ref 3.87–5.11)
RDW: 12.9 % (ref 11.5–15.5)
WBC: 11.6 10*3/uL — ABNORMAL HIGH (ref 4.0–10.5)

## 2013-07-26 LAB — POCT I-STAT, CHEM 8
Chloride: 94 mEq/L — ABNORMAL LOW (ref 96–112)
Creatinine, Ser: 1 mg/dL (ref 0.50–1.10)
Glucose, Bld: 269 mg/dL — ABNORMAL HIGH (ref 70–99)
Potassium: 4 mEq/L (ref 3.5–5.1)

## 2013-07-26 MED ORDER — OXYMETAZOLINE HCL 0.05 % NA SOLN
1.0000 | Freq: Two times a day (BID) | NASAL | Status: DC | PRN
  Administered 2013-07-26: 1 via NASAL
  Filled 2013-07-26: qty 15

## 2013-07-26 NOTE — ED Notes (Signed)
291 mg/dl reported by [facility], EMT

## 2013-07-26 NOTE — ED Notes (Signed)
Pt states that she was told to go to the ER by her PCP because of her blood sugar results in office; pt otherwise feels good.

## 2013-07-26 NOTE — ED Provider Notes (Addendum)
CSN: [id number]     Arrival date & time 07/26/13  1323 History   First MD Initiated Contact with Patient 07/26/13 1339     Chief Complaint  Patient presents with  . Hyperglycemia   (Consider location/radiation/quality/duration/timing/severity/associated sxs/prior Treatment) HPI Comments: Pt came today because her doctor called and told her to go to urgent care due to elevated blood sugar.  She saw her PCP on Friday for routine visit and blood work.  She states today she was in her normal state of health other than sinus sx with congestion and rhinorrhea.  She got OTC sinus medication last night and states it is working well. She does admit to eating candy, drinking sweet tea and eating carbs which she feels is the reason for the hyperglycemia today.  Denies urinary sx, abd pain or other complaints.  Last week she had an episode where she was coughing and had a brief episode of syncope where she states she hit her head but feels fine now.  She states this happens frequently with coughing and has not changed.  Denies lightheadedness, palp or dizziness in the last 3 days.  The history is provided by the patient.    Past Medical History  Diagnosis Date  . Asthma   . Hypertension   . Arthritis   . Diabetes mellitus     type II  . Allergy   . Depression   . Hyperlipidemia   . Seizures    Past Surgical History  Procedure Laterality Date  . Foot surgery    . Salpingoophorectomy    . Tubal ligation    . Cystectomy    . Appendectomy     Family History  Problem Relation Age of Onset  . Heart disease Mother   . Diabetes Mother   . Depression Mother   . Cancer Father   . Diabetes Father   . Depression Father   . Diabetes Sister   . Kidney disease Brother   . Diabetes Brother   . Kidney disease Brother   . Diabetes Brother   . Diabetes Brother   . Diabetes Brother    History  Substance Use Topics  . Smoking status: Current Every Day Smoker -- 0.50 packs/day  . Smokeless  tobacco: Never Used  . Alcohol Use: No   OB History   Grav Para Term Preterm Abortions TAB SAB Ect Mult Living   4 2 2  2  2   2      Review of Systems  All other systems reviewed and are negative.    Allergies  Aspirin and Chocolate  Home Medications   Current Outpatient Rx  Name  Route  Sig  Dispense  Refill  . amLODipine (NORVASC) 5 MG tablet   Oral   Take 5 mg by mouth daily.         . benztropine (COGENTIN) 0.5 MG tablet   Oral   Take 0.5 mg by mouth daily.         . Bisacodyl (LAXATIVE PO)   Oral   Take by mouth.         . diazepam (VALIUM) 5 MG tablet   Oral   Take 5 mg by mouth every 12 (twelve) hours as needed.         . fenofibrate (TRICOR) 48 MG tablet   Oral   Take 48 mg by mouth daily.         . fluPHENAZine decanoate (PROLIXIN) 25 MG/ML injection               .  gabapentin (NEURONTIN) 300 MG capsule   Oral   Take 300 mg by mouth 3 (three) times daily.         . insulin glargine (LANTUS) 100 UNIT/ML injection   Subcutaneous   Inject 35 Units into the skin at bedtime.         Marland Kitchen lisinopril (PRINIVIL,ZESTRIL) 10 MG tablet   Oral   Take 10 mg by mouth daily.         Marland Kitchen lisinopril-hydrochlorothiazide (PRINZIDE,ZESTORETIC) 20-25 MG per tablet               . loratadine (CLARITIN) 10 MG tablet   Oral   Take 10 mg by mouth daily.         . metFORMIN (GLUCOPHAGE) 500 MG tablet   Oral   Take 500 mg by mouth 2 (two) times daily with a meal.         . nystatin (MYCOSTATIN) 100000 UNIT/ML suspension               . polyethylene glycol powder (GLYCOLAX/MIRALAX) powder   Oral   Take 17 g by mouth daily.         Marland Kitchen PROAIR HFA 108 (90 BASE) MCG/ACT inhaler               . promethazine-codeine (PHENERGAN WITH CODEINE) 6.25-10 MG/5ML syrup                BP 160/91  Pulse 76  Temp(Src) 98.3 F (36.8 C) (Oral)  Resp 12  SpO2 100% Physical Exam  Nursing note and vitals reviewed. Constitutional: She is  oriented to person, place, and time. She appears well-developed and well-nourished. No distress.  HENT:  Head: Normocephalic and atraumatic.  Mouth/Throat: Oropharynx is clear and moist.  Poor dentition.  No abscessed teeth  Eyes: Conjunctivae and EOM are normal. Pupils are equal, round, and reactive to light.  Neck: Normal range of motion. Neck supple. No spinous process tenderness and no muscular tenderness present.  Cardiovascular: Normal rate, regular rhythm and intact distal pulses.   No murmur heard. Pulmonary/Chest: Effort normal and breath sounds normal. No respiratory distress. She has no wheezes. She has no rales.  Abdominal: Soft. She exhibits no distension. There is no tenderness. There is no rebound and no guarding.  Musculoskeletal: Normal range of motion. She exhibits no edema and no tenderness.  Neurological: She is alert and oriented to person, place, and time.  Skin: Skin is warm and dry. No rash noted. No erythema.  Psychiatric: She has a normal mood and affect. Her behavior is normal.    ED Course  Procedures (including critical care time) Labs Review Labs Reviewed  GLUCOSE, CAPILLARY - Abnormal; Notable for the following:    Glucose-Capillary 291 (*)    All other components within normal limits  CBC WITH DIFFERENTIAL - Abnormal; Notable for the following:    WBC 11.6 (*)    Hemoglobin 15.4 (*)    MCHC 36.4 (*)    Lymphs Abs 4.7 (*)    All other components within normal limits  URINALYSIS, ROUTINE W REFLEX MICROSCOPIC - Abnormal; Notable for the following:    Glucose, UA 500 (*)    All other components within normal limits  POCT I-STAT, CHEM 8 - Abnormal; Notable for the following:    Sodium 132 (*)    Chloride 94 (*)    Glucose, Bld 269 (*)    All other components within normal limits   Imaging Review No results  found.  EKG Interpretation   None       MDM   1. Hyperglycemia     Patient with multiple medical problems including asthma,  hypertension, diabetes controlled with insulin and metformin he presents today because she was called by her physician's office after getting lab work on Friday with an abnormal number. Patient states she was told to come here because her blood sugar was high. She has no complaints today and states she feels fairly well. Patient does have a blood sugar of 290 here but does admit to drinking sweet tea and eating candy.  Patient denies abdominal pain, nausea, vomiting or urinary symptoms. Because unsure if patient had abnormal testing otherwise we'll recheck a potassium and a creatinine level.  3:26 PM Labs normal except for hyperglycemia.  Will d/c home and counselled about diabetic diet.   Gwyneth Sprout, MD 07/26/13 1530  Gwyneth Sprout, MD 07/26/13 [id number]

## 2013-07-26 NOTE — ED Notes (Signed)
62 yo pt presents to the ED with hyperglycemia.  Pt reports that she visited her PCP the other day and he called and told her to come to the emergency room because of her high blood sugar.  Pt reports that she also had a syncopal episode on Wednesday in the bathroom at home and hit the left side of her head and left shoulder.

## 2013-07-31 ENCOUNTER — Other Ambulatory Visit (HOSPITAL_COMMUNITY): Payer: Self-pay | Admitting: Specialist

## 2013-07-31 DIAGNOSIS — Z1231 Encounter for screening mammogram for malignant neoplasm of breast: Secondary | ICD-10-CM

## 2013-08-19 ENCOUNTER — Ambulatory Visit (HOSPITAL_COMMUNITY): Payer: Medicare PPO

## 2013-08-20 ENCOUNTER — Ambulatory Visit (HOSPITAL_COMMUNITY): Payer: Medicare PPO

## 2013-08-20 ENCOUNTER — Ambulatory Visit (HOSPITAL_COMMUNITY)
Admission: RE | Admit: 2013-08-20 | Discharge: 2013-08-20 | Disposition: A | Discharge status: Home / Self Care | Payer: Medicare PPO | Source: Ambulatory Visit | Attending: Specialist | Admitting: Specialist

## 2013-08-20 DIAGNOSIS — Z1231 Encounter for screening mammogram for malignant neoplasm of breast: Secondary | ICD-10-CM | POA: Insufficient documentation

## 2013-09-16 ENCOUNTER — Ambulatory Visit: Payer: Medicare PPO

## 2013-09-18 ENCOUNTER — Ambulatory Visit: Payer: Medicare PPO | Admitting: Podiatry

## 2013-12-23 ENCOUNTER — Ambulatory Visit: Payer: Medicare PPO | Admitting: Podiatry

## 2014-01-08 ENCOUNTER — Ambulatory Visit: Payer: Medicare PPO | Admitting: Podiatry

## 2014-01-22 ENCOUNTER — Ambulatory Visit (INDEPENDENT_AMBULATORY_CARE_PROVIDER_SITE_OTHER): Payer: Medicare PPO | Admitting: Podiatry

## 2014-01-22 ENCOUNTER — Encounter: Payer: Self-pay | Admitting: Podiatry

## 2014-01-22 VITALS — BP 128/82 | HR 65 | Resp 16

## 2014-01-22 DIAGNOSIS — Q828 Other specified congenital malformations of skin: Secondary | ICD-10-CM

## 2014-01-22 DIAGNOSIS — E1140 Type 2 diabetes mellitus with diabetic neuropathy, unspecified: Secondary | ICD-10-CM

## 2014-01-22 DIAGNOSIS — M79609 Pain in unspecified limb: Secondary | ICD-10-CM

## 2014-01-22 DIAGNOSIS — E1142 Type 2 diabetes mellitus with diabetic polyneuropathy: Secondary | ICD-10-CM

## 2014-01-22 DIAGNOSIS — E1149 Type 2 diabetes mellitus with other diabetic neurological complication: Secondary | ICD-10-CM

## 2014-01-22 DIAGNOSIS — B351 Tinea unguium: Secondary | ICD-10-CM

## 2014-01-22 MED ORDER — GABAPENTIN 800 MG PO TABS: 800.0000 mg | ORAL_TABLET | Freq: Three times a day (TID) | ORAL | Status: DC

## 2014-01-22 NOTE — Progress Notes (Signed)
She presents today with a chief complaint of painful elongated toenails and painful soft tissue lesions plantar aspect of the bilateral foot. States that her blood sugars under control.  Objective: Vital signs are stable she is alert and oriented x3 pulses are palpable bilateral. Neurologic deficit secondary to diabetes is present bilaterally per Semmes-Weinstein monofilament. Nails are thick yellow dystrophic with mycotic painful palpation as are porokeratosis plantar aspect of the bilateral foot.  Assessment: Pain in limb secondary to diabetic peripheral neuropathy onychomycosis and porokeratosis.  Plan: Debridement of nails in thickness and length and debridement of all reactive hyperkeratosis. Evaluated for diabetic shoes.

## 2014-01-22 NOTE — Patient Instructions (Signed)
Diabetes and Foot Care Diabetes may cause you to have problems because of poor blood supply (circulation) to your feet and legs. This may cause the skin on your feet to become thinner, break easier, and heal more slowly. Your skin may become dry, and the skin may peel and crack. You may also have nerve damage in your legs and feet causing decreased feeling in them. You may not notice minor injuries to your feet that could lead to infections or more serious problems. Taking care of your feet is one of the most important things you can do for yourself.  HOME CARE INSTRUCTIONS  Wear shoes at all times, even in the house. Do not go barefoot. Bare feet are easily injured.  Check your feet daily for blisters, cuts, and redness. If you cannot see the bottom of your feet, use a mirror or ask someone for help.  Wash your feet with warm water (do not use hot water) and mild soap. Then pat your feet and the areas between your toes until they are completely dry. Do not soak your feet as this can dry your skin.  Apply a moisturizing lotion or petroleum jelly (that does not contain alcohol and is unscented) to the skin on your feet and to dry, brittle toenails. Do not apply lotion between your toes.  Trim your toenails straight across. Do not dig under them or around the cuticle. File the edges of your nails with an emery board or nail file.  Do not cut corns or calluses or try to remove them with medicine.  Wear clean socks or stockings every day. Make sure they are not too tight. Do not wear knee-high stockings since they may decrease blood flow to your legs.  Wear shoes that fit properly and have enough cushioning. To break in new shoes, wear them for just a few hours a day. This prevents you from injuring your feet. Always look in your shoes before you put them on to be sure there are no objects inside.  Do not cross your legs. This may decrease the blood flow to your feet.  If you find a minor scrape,  cut, or break in the skin on your feet, keep it and the skin around it clean and dry. These areas may be cleansed with mild soap and water. Do not cleanse the area with peroxide, alcohol, or iodine.  When you remove an adhesive bandage, be sure not to damage the skin around it.  If you have a wound, look at it several times a day to make sure it is healing.  Do not use heating pads or hot water bottles. They may burn your skin. If you have lost feeling in your feet or legs, you may not know it is happening until it is too late.  Make sure your health care provider performs a complete foot exam at least annually or more often if you have foot problems. Report any cuts, sores, or bruises to your health care provider immediately. SEEK MEDICAL CARE IF:   You have an injury that is not healing.  You have cuts or breaks in the skin.  You have an ingrown nail.  You notice redness on your legs or feet.  You feel burning or tingling in your legs or feet.  You have pain or cramps in your legs and feet.  Your legs or feet are numb.  Your feet always feel cold. SEEK IMMEDIATE MEDICAL CARE IF:   There is increasing redness,   swelling, or pain in or around a wound.  There is a red line that goes up your leg.  Pus is coming from a wound.  You develop a fever or as directed by your health care provider.  You notice a bad smell coming from an ulcer or wound. Document Released: 08/25/2000 Document Revised: 04/30/2013 Document Reviewed: 02/04/2013 [employer] Patient Information 2014 [employer].  

## 2014-03-31 ENCOUNTER — Ambulatory Visit: Payer: Medicare PPO | Admitting: Podiatry

## 2014-04-04 ENCOUNTER — Emergency Department (HOSPITAL_COMMUNITY): Payer: Medicare PPO

## 2014-04-04 ENCOUNTER — Encounter (HOSPITAL_COMMUNITY): Payer: Self-pay | Admitting: Emergency Medicine

## 2014-04-04 ENCOUNTER — Emergency Department (HOSPITAL_COMMUNITY)
Admission: EM | Admit: 2014-04-04 | Discharge: 2014-04-05 | Disposition: A | Discharge status: Home / Self Care | Payer: Medicare PPO | Attending: Emergency Medicine | Admitting: Emergency Medicine

## 2014-04-04 DIAGNOSIS — F3289 Other specified depressive episodes: Secondary | ICD-10-CM | POA: Diagnosis not present

## 2014-04-04 DIAGNOSIS — F329 Major depressive disorder, single episode, unspecified: Secondary | ICD-10-CM | POA: Insufficient documentation

## 2014-04-04 DIAGNOSIS — M47812 Spondylosis without myelopathy or radiculopathy, cervical region: Secondary | ICD-10-CM | POA: Insufficient documentation

## 2014-04-04 DIAGNOSIS — R51 Headache: Secondary | ICD-10-CM | POA: Diagnosis not present

## 2014-04-04 DIAGNOSIS — I10 Essential (primary) hypertension: Secondary | ICD-10-CM | POA: Diagnosis not present

## 2014-04-04 DIAGNOSIS — M25579 Pain in unspecified ankle and joints of unspecified foot: Secondary | ICD-10-CM | POA: Insufficient documentation

## 2014-04-04 DIAGNOSIS — M79609 Pain in unspecified limb: Secondary | ICD-10-CM | POA: Insufficient documentation

## 2014-04-04 DIAGNOSIS — E785 Hyperlipidemia, unspecified: Secondary | ICD-10-CM | POA: Diagnosis not present

## 2014-04-04 DIAGNOSIS — E119 Type 2 diabetes mellitus without complications: Secondary | ICD-10-CM | POA: Diagnosis not present

## 2014-04-04 DIAGNOSIS — Z79899 Other long term (current) drug therapy: Secondary | ICD-10-CM | POA: Diagnosis not present

## 2014-04-04 DIAGNOSIS — J45909 Unspecified asthma, uncomplicated: Secondary | ICD-10-CM | POA: Insufficient documentation

## 2014-04-04 DIAGNOSIS — Z87828 Personal history of other (healed) physical injury and trauma: Secondary | ICD-10-CM | POA: Diagnosis not present

## 2014-04-04 DIAGNOSIS — G8929 Other chronic pain: Secondary | ICD-10-CM

## 2014-04-04 DIAGNOSIS — G40909 Epilepsy, unspecified, not intractable, without status epilepticus: Secondary | ICD-10-CM | POA: Insufficient documentation

## 2014-04-04 DIAGNOSIS — F17200 Nicotine dependence, unspecified, uncomplicated: Secondary | ICD-10-CM | POA: Insufficient documentation

## 2014-04-04 DIAGNOSIS — R209 Unspecified disturbances of skin sensation: Secondary | ICD-10-CM | POA: Diagnosis not present

## 2014-04-04 LAB — CBC WITH DIFFERENTIAL/PLATELET
Basophils Absolute: 0 10*3/uL (ref 0.0–0.1)
Basophils Relative: 0 % (ref 0–1)
Eosinophils Absolute: 0.2 10*3/uL (ref 0.0–0.7)
Eosinophils Relative: 3 % (ref 0–5)
HCT: 39.9 % (ref 36.0–46.0)
Hemoglobin: 13.9 g/dL (ref 12.0–15.0)
LYMPHS ABS: 3.4 10*3/uL (ref 0.7–4.0)
LYMPHS PCT: 45 % (ref 12–46)
MCH: 30.9 pg (ref 26.0–34.0)
MCHC: 34.8 g/dL (ref 30.0–36.0)
MCV: 88.7 fL (ref 78.0–100.0)
Monocytes Absolute: 0.4 10*3/uL (ref 0.1–1.0)
Monocytes Relative: 6 % (ref 3–12)
NEUTROS ABS: 3.5 10*3/uL (ref 1.7–7.7)
NEUTROS PCT: 46 % (ref 43–77)
Platelets: 268 10*3/uL (ref 150–400)
RBC: 4.5 MIL/uL (ref 3.87–5.11)
RDW: 13.7 % (ref 11.5–15.5)
WBC: 7.6 10*3/uL (ref 4.0–10.5)

## 2014-04-04 NOTE — ED Notes (Addendum)
Multiple complaints. Reports hx of spraining left ankle but now having severe pain that radiates up entire left side of body. Reports numbness to left arm and body since 0600. Feeling lightheaded today and has a headache. No acute distress noted. No swelling or redness noted. No neuro deficits noted at triage.

## 2014-04-04 NOTE — ED Provider Notes (Signed)
CSN: [id number]     Arrival date & time 04/04/14  1825 History   First MD Initiated Contact with Patient 04/04/14 2303     Chief Complaint  Patient presents with  . Leg Pain     (Consider location/radiation/quality/duration/timing/severity/associated sxs/prior Treatment) HPI Patient presents chronic left ankle pain that she's had for greater than 10 years. She states she twisted her ankle 6 days ago and exacerbated the pain. She was kept the ankle wrapped with Ace bandage. She states that today she was unable to bear the pain. The pain weeps from her ankle her workup. She also states radiates into her head. She complains of left thumb numbness. She states she's had this for many years due to previous accident. She has no new focal numbness or weakness. She said she intermittently takes her Neurontin. When asked where her headache is she points to the trapezius muscle stating that she has arthritis in her neck. She denies any head or neck trauma. Past Medical History  Diagnosis Date  . Asthma   . Hypertension   . Arthritis   . Diabetes mellitus     type II  . Allergy   . Depression   . Hyperlipidemia   . Seizures    Past Surgical History  Procedure Laterality Date  . Foot surgery    . Salpingoophorectomy    . Tubal ligation    . Cystectomy    . Appendectomy     Family History  Problem Relation Age of Onset  . Heart disease Mother   . Diabetes Mother   . Depression Mother   . Cancer Father   . Diabetes Father   . Depression Father   . Diabetes Sister   . Kidney disease Brother   . Diabetes Brother   . Kidney disease Brother   . Diabetes Brother   . Diabetes Brother   . Diabetes Brother    History  Substance Use Topics  . Smoking status: Current Every Day Smoker -- 0.50 packs/day  . Smokeless tobacco: Never Used  . Alcohol Use: Yes     Comment: occassional glass of wine   OB History   Grav Para Term Preterm Abortions TAB SAB Ect Mult Living   4 2 2  2  2   2       Review of Systems  Constitutional: Negative for fever and chills.  Respiratory: Negative for shortness of breath.   Cardiovascular: Negative for chest pain.  Gastrointestinal: Negative for nausea, vomiting and abdominal pain.  Musculoskeletal: Positive for arthralgias and neck pain. Negative for back pain, myalgias and neck stiffness.  Skin: Negative for rash and wound.  Neurological: Positive for numbness and headaches. Negative for dizziness, weakness and light-headedness.  All other systems reviewed and are negative.     Allergies  Aspirin; Other; and Chocolate  Home Medications   Prior to Admission medications   Medication Sig Start Date End Date Taking? Authorizing Provider  albuterol (PROAIR HFA) 108 (90 BASE) MCG/ACT inhaler Inhale 2 puffs into the lungs 3 (three) times daily.    Historical Provider, MD  benztropine (COGENTIN) 0.5 MG tablet Take 0.5 mg by mouth 3 (three) times daily as needed for tremors.     Historical Provider, MD  fluPHENAZine (PROLIXIN) 10 MG tablet Take 10 mg by mouth at bedtime.    Historical Provider, MD  gabapentin (NEURONTIN) 300 MG capsule Take 300 mg by mouth 3 (three) times daily.    Historical Provider, MD  gabapentin (NEURONTIN) 800  MG tablet Take 1 tablet (800 mg total) by mouth 3 (three) times daily. 01/22/14   Max T Hyatt, DPM  Green Tea, Camillia sinensis, (GREEN TEA PO) Take 1 capsule by mouth 2 (two) times daily.    Historical Provider, MD  insulin glargine (LANTUS) 100 UNIT/ML injection Inject 25-35 Units into the skin at bedtime.     Historical Provider, MD  lisinopril (PRINIVIL,ZESTRIL) 10 MG tablet Take 10 mg by mouth daily.    Historical Provider, MD  loratadine (CLARITIN) 10 MG tablet Take 10 mg by mouth 3 (three) times daily as needed for allergies.     Historical Provider, MD  metFORMIN (GLUCOPHAGE) 500 MG tablet Take by mouth 2 (two) times daily with a meal.    Historical Provider, MD  polyethylene glycol powder (GLYCOLAX/MIRALAX)  powder Take 17 g by mouth 2 (two) times daily.     Historical Provider, MD  traMADol (ULTRAM) 50 MG tablet Take 50 mg by mouth every 6 (six) hours as needed (Dental pain).     Historical Provider, MD   BP 147/75  Pulse 61  Temp(Src) 97.8 F (36.6 C) (Oral)  Resp 18  SpO2 96% Physical Exam  Nursing note and vitals reviewed. Constitutional: She is oriented to person, place, and time. She appears well-developed and well-nourished. No distress.  HENT:  Head: Normocephalic and atraumatic.  Mouth/Throat: Oropharynx is clear and moist. No oropharyngeal exudate.  Eyes: EOM are normal. Pupils are equal, round, and reactive to light.  Neck: Normal range of motion. Neck supple.  No midline cervical tenderness to palpation. Mild tenderness over the left trapezius.  Cardiovascular: Normal rate and regular rhythm.   Pulmonary/Chest: Effort normal and breath sounds normal. No respiratory distress. She has no wheezes. She has no rales. She exhibits no tenderness.  Abdominal: Soft. Bowel sounds are normal. She exhibits no distension and no mass. There is no tenderness. There is no rebound and no guarding.  Musculoskeletal: Normal range of motion. She exhibits no edema. Tenderness: tenderness to palpation over the medial malleolus and lateral malleolus. No evidence of any acute trauma. No swelling or deformity.  2+ DP. No calf tenderness/swelling  Neurological: She is alert and oriented to person, place, and time.  5/5 motor in all extremities. Subjective decrease sensation to the left thumb otherwise sensation fully intact.  Skin: Skin is warm and dry. No rash noted. No erythema.  Psychiatric: She has a normal mood and affect. Her behavior is normal.    ED Course  Procedures (including critical care time) Labs Review Labs Reviewed  CBC WITH DIFFERENTIAL  BASIC METABOLIC PANEL  URINALYSIS, ROUTINE W REFLEX MICROSCOPIC    Imaging Review No results found.   EKG Interpretation None      MDM    Final diagnoses:  None    Ankle x-ray without any acute abnormalities. Patient's symptoms appear to be exacerbations of her chronic condition. She is advised to followup with her doctor.    Julianne Rice, MD 04/05/14 [phone]

## 2014-04-05 LAB — URINALYSIS, ROUTINE W REFLEX MICROSCOPIC
Bilirubin Urine: NEGATIVE
GLUCOSE, UA: NEGATIVE mg/dL
Hgb urine dipstick: NEGATIVE
Ketones, ur: NEGATIVE mg/dL
LEUKOCYTES UA: NEGATIVE
Nitrite: NEGATIVE
PH: 5.5 (ref 5.0–8.0)
PROTEIN: NEGATIVE mg/dL
Specific Gravity, Urine: 1.008 (ref 1.005–1.030)
Urobilinogen, UA: 0.2 mg/dL (ref 0.0–1.0)

## 2014-04-05 LAB — BASIC METABOLIC PANEL
Anion gap: 13 (ref 5–15)
BUN: 8 mg/dL (ref 6–23)
CO2: 25 meq/L (ref 19–32)
Calcium: 9.8 mg/dL (ref 8.4–10.5)
Chloride: 100 mEq/L (ref 96–112)
Creatinine, Ser: 0.76 mg/dL (ref 0.50–1.10)
GFR calc Af Amer: >90 mL/min (ref >90)
GFR calc non Af Amer: 87 mL/min — ABNORMAL LOW (ref >90)
GLUCOSE: 48 mg/dL — AB (ref 70–99)
POTASSIUM: 3.4 meq/L — AB (ref 3.7–5.3)
Sodium: 138 mEq/L (ref 137–147)

## 2014-04-05 LAB — CBG MONITORING, ED: Glucose-Capillary: 154 mg/dL — ABNORMAL HIGH (ref 70–99)

## 2014-04-05 NOTE — ED Notes (Signed)
Pt ate [place] sandwich and apple juice CBG after 154

## 2014-05-07 ENCOUNTER — Ambulatory Visit: Payer: Medicare PPO | Admitting: Podiatry

## 2014-06-16 ENCOUNTER — Ambulatory Visit: Payer: Medicare PPO | Admitting: Podiatry

## 2014-06-23 ENCOUNTER — Ambulatory Visit: Payer: Medicare PPO | Admitting: Podiatry

## 2014-06-30 ENCOUNTER — Ambulatory Visit (INDEPENDENT_AMBULATORY_CARE_PROVIDER_SITE_OTHER): Payer: Medicare PPO | Admitting: Podiatry

## 2014-06-30 ENCOUNTER — Encounter: Payer: Self-pay | Admitting: Podiatry

## 2014-06-30 DIAGNOSIS — Q828 Other specified congenital malformations of skin: Secondary | ICD-10-CM

## 2014-06-30 DIAGNOSIS — E1140 Type 2 diabetes mellitus with diabetic neuropathy, unspecified: Secondary | ICD-10-CM

## 2014-06-30 NOTE — Progress Notes (Signed)
   Subjective:    Patient ID: [name], female    DOB: November 17, 1950, 63 y.o.   MRN: [id number]  HPI Comments: Presents today chief complaint of painful elongated toenails.  Objective: Pulses are palpable bilateral nails are thick, yellow dystrophic onychomycosis and painful palpation.   Assessment: Onychomycosis with pain in limb and porokeratosis.  Plan: Treatment of nails in thickness and length as covered service secondary to pain. Debridement all reactive hyperkeratosis.      Review of Systems     Objective:   Physical Exam        Assessment & Plan:

## 2014-06-30 NOTE — Patient Instructions (Signed)
Diabetes and Foot Care Diabetes may cause you to have problems because of poor blood supply (circulation) to your feet and legs. This may cause the skin on your feet to become thinner, break easier, and heal more slowly. Your skin may become dry, and the skin may peel and crack. You may also have nerve damage in your legs and feet causing decreased feeling in them. You may not notice minor injuries to your feet that could lead to infections or more serious problems. Taking care of your feet is one of the most important things you can do for yourself.  HOME CARE INSTRUCTIONS  Wear shoes at all times, even in the house. Do not go barefoot. Bare feet are easily injured.  Check your feet daily for blisters, cuts, and redness. If you cannot see the bottom of your feet, use a mirror or ask someone for help.  Wash your feet with warm water (do not use hot water) and mild soap. Then pat your feet and the areas between your toes until they are completely dry. Do not soak your feet as this can dry your skin.  Apply a moisturizing lotion or petroleum jelly (that does not contain alcohol and is unscented) to the skin on your feet and to dry, brittle toenails. Do not apply lotion between your toes.  Trim your toenails straight across. Do not dig under them or around the cuticle. File the edges of your nails with an emery board or nail file.  Do not cut corns or calluses or try to remove them with medicine.  Wear clean socks or stockings every day. Make sure they are not too tight. Do not wear knee-high stockings since they may decrease blood flow to your legs.  Wear shoes that fit properly and have enough cushioning. To break in new shoes, wear them for just a few hours a day. This prevents you from injuring your feet. Always look in your shoes before you put them on to be sure there are no objects inside.  Do not cross your legs. This may decrease the blood flow to your feet.  If you find a minor scrape,  cut, or break in the skin on your feet, keep it and the skin around it clean and dry. These areas may be cleansed with mild soap and water. Do not cleanse the area with peroxide, alcohol, or iodine.  When you remove an adhesive bandage, be sure not to damage the skin around it.  If you have a wound, look at it several times a day to make sure it is healing.  Do not use heating pads or hot water bottles. They may burn your skin. If you have lost feeling in your feet or legs, you may not know it is happening until it is too late.  Make sure your health care provider performs a complete foot exam at least annually or more often if you have foot problems. Report any cuts, sores, or bruises to your health care provider immediately. SEEK MEDICAL CARE IF:   You have an injury that is not healing.  You have cuts or breaks in the skin.  You have an ingrown nail.  You notice redness on your legs or feet.  You feel burning or tingling in your legs or feet.  You have pain or cramps in your legs and feet.  Your legs or feet are numb.  Your feet always feel cold. SEEK IMMEDIATE MEDICAL CARE IF:   There is increasing redness,   swelling, or pain in or around a wound.  There is a red line that goes up your leg.  Pus is coming from a wound.  You develop a fever or as directed by your health care provider.  You notice a bad smell coming from an ulcer or wound. Document Released: 08/25/2000 Document Revised: 04/30/2013 Document Reviewed: 02/04/2013 Exit[employer] Patient Information 2015 [employer]. This information is not intended to replace advice given to you by your health care provider. Make sure you discuss any questions you have with your health care provider.  

## 2014-07-13 ENCOUNTER — Encounter: Payer: Self-pay | Admitting: Podiatry

## 2014-07-13 ENCOUNTER — Other Ambulatory Visit (HOSPITAL_COMMUNITY): Payer: Self-pay | Admitting: Specialist

## 2014-07-13 DIAGNOSIS — Z1231 Encounter for screening mammogram for malignant neoplasm of breast: Secondary | ICD-10-CM

## 2014-07-28 ENCOUNTER — Ambulatory Visit: Payer: Medicare PPO | Admitting: Podiatry

## 2014-08-11 ENCOUNTER — Encounter: Payer: Self-pay | Admitting: Podiatry

## 2014-08-11 ENCOUNTER — Ambulatory Visit (INDEPENDENT_AMBULATORY_CARE_PROVIDER_SITE_OTHER): Payer: Medicare PPO | Admitting: Podiatry

## 2014-08-11 DIAGNOSIS — E1140 Type 2 diabetes mellitus with diabetic neuropathy, unspecified: Secondary | ICD-10-CM

## 2014-08-11 DIAGNOSIS — M79673 Pain in unspecified foot: Secondary | ICD-10-CM

## 2014-08-11 DIAGNOSIS — M2040 Other hammer toe(s) (acquired), unspecified foot: Secondary | ICD-10-CM

## 2014-08-11 DIAGNOSIS — Q828 Other specified congenital malformations of skin: Secondary | ICD-10-CM

## 2014-08-11 NOTE — Patient Instructions (Signed)

## 2014-08-11 NOTE — Progress Notes (Signed)
Dispensed diabetic shoes and 3 pairs of insoles. Instructions were reviewed and a copy was given to the patient. Patient to reappointment for regularly scheduled diabetic foot care visits or if she experiences any trouble with her diabetic shoes. 

## 2014-08-27 ENCOUNTER — Ambulatory Visit (HOSPITAL_COMMUNITY)
Admission: RE | Admit: 2014-08-27 | Discharge: 2014-08-27 | Disposition: A | Discharge status: Home / Self Care | Payer: Medicare PPO | Source: Ambulatory Visit | Attending: Specialist | Admitting: Specialist

## 2014-08-27 DIAGNOSIS — Z1231 Encounter for screening mammogram for malignant neoplasm of breast: Secondary | ICD-10-CM | POA: Diagnosis present

## 2014-10-01 ENCOUNTER — Other Ambulatory Visit: Payer: Medicare PPO

## 2014-12-17 ENCOUNTER — Ambulatory Visit: Payer: Medicare PPO | Admitting: Neurology

## 2014-12-18 ENCOUNTER — Encounter: Payer: Self-pay | Admitting: Neurology

## 2014-12-18 ENCOUNTER — Ambulatory Visit
Admission: RE | Admit: 2014-12-18 | Discharge: 2014-12-18 | Disposition: A | Discharge status: Home / Self Care | Payer: Medicare Other | Source: Ambulatory Visit | Attending: Neurology | Admitting: Neurology

## 2014-12-18 ENCOUNTER — Other Ambulatory Visit: Payer: Self-pay | Admitting: Neurology

## 2014-12-18 ENCOUNTER — Ambulatory Visit (INDEPENDENT_AMBULATORY_CARE_PROVIDER_SITE_OTHER): Payer: Medicare Other | Admitting: Neurology

## 2014-12-18 VITALS — BP 122/78 | HR 72 | Ht 67.0 in | Wt 193.0 lb

## 2014-12-18 DIAGNOSIS — R404 Transient alteration of awareness: Secondary | ICD-10-CM

## 2014-12-18 DIAGNOSIS — Z9889 Other specified postprocedural states: Secondary | ICD-10-CM

## 2014-12-18 LAB — BUN: BUN: 15 mg/dL (ref 6–23)

## 2014-12-18 LAB — CREATININE, SERUM: Creat: 0.99 mg/dL (ref 0.50–1.10)
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
## 2014-12-18 NOTE — Patient Instructions (Addendum)
1. Schedule MRI brain with and without contrast 2. Routine EEG, then plan for 24-hour EEG 3. Continue all your medications 4. As per [place] driving laws, after an episode of loss of consciousness/awareness, one should not drive until 6 months event-free  YOU HAVE BEEN SCHEDULED FOR A BRAIN MRI @ [facility] ON 12/23/14 @ 11:45 AM PLEASE ARRIVE @ 11:15 AM  [address] [place], [place] [address] [phone]

## 2014-12-22 ENCOUNTER — Encounter: Payer: Self-pay | Admitting: Neurology

## 2014-12-28 ENCOUNTER — Other Ambulatory Visit: Payer: Medicare Other

## 2014-12-28 DIAGNOSIS — Z029 Encounter for administrative examinations, unspecified: Secondary | ICD-10-CM

## 2014-12-29 ENCOUNTER — Encounter: Payer: Self-pay | Admitting: Neurology

## 2014-12-30 ENCOUNTER — Telehealth: Payer: Self-pay | Admitting: Family Medicine

## 2014-12-30 NOTE — Telephone Encounter (Signed)
-----   Message from Cameron Sprang, MD sent at 12/30/2014  3:55 PM EDT ----- Regarding: mri results Pls let her know I reviewed MRI brain, looks good, no evidence of tumor, new stroke, or bleed. Thanks

## 2014-12-30 NOTE — Telephone Encounter (Signed)
Lmovm to return my call. 

## 2014-12-31 NOTE — Telephone Encounter (Signed)
I called patient and notified of results.

## 2015-01-08 ENCOUNTER — Other Ambulatory Visit (HOSPITAL_COMMUNITY): Payer: Self-pay | Admitting: Specialist

## 2015-01-08 DIAGNOSIS — Z1231 Encounter for screening mammogram for malignant neoplasm of breast: Secondary | ICD-10-CM

## 2015-01-10 ENCOUNTER — Other Ambulatory Visit: Payer: Self-pay | Admitting: Podiatry

## 2015-01-18 ENCOUNTER — Ambulatory Visit (INDEPENDENT_AMBULATORY_CARE_PROVIDER_SITE_OTHER): Payer: Medicare Other | Admitting: Neurology

## 2015-01-18 DIAGNOSIS — R404 Transient alteration of awareness: Secondary | ICD-10-CM | POA: Diagnosis not present

## 2015-01-20 NOTE — Procedures (Signed)
ELECTROENCEPHALOGRAM REPORT  Date of Study: 01/18/2015  Patient's Name: [name] MRN: [id number] Date of Birth: 05-03-1951  Referring Provider: Dr. Ellouise Newer  Clinical History: This is a 64 year old woman with recurrent episodes of blacking out  Medications: Neurontin, Prolixin, Zoloft  Technical Summary: A multichannel digital EEG recording measured by the international 10-20 system with electrodes applied with paste and impedances below 5000 ohms performed in our laboratory with EKG monitoring in an awake and asleep patient.  Hyperventilation and photic stimulation were performed.  The digital EEG was referentially recorded, reformatted, and digitally filtered in a variety of bipolar and referential montages for optimal display.    Description: The patient is awake and asleep during the recording.  During maximal wakefulness, there is a symmetric, medium voltage 9-9.5 Hz posterior dominant rhythm that attenuates with eye opening.  The record is symmetric.  During drowsiness and stage I sleep, there is an increase in theta slowing of the background with occasional vertex waves seen.  Hyperventilation and photic stimulation did not elicit any abnormalities.  There were no epileptiform discharges or electrographic seizures seen.    EKG lead was unremarkable.  Impression: This awake and asleep EEG is normal.    Clinical Correlation: A normal EEG does not exclude a clinical diagnosis of epilepsy.  If further clinical questions remain, prolonged EEG may be helpful.  Clinical correlation is advised.   Ellouise Newer, M.D.

## 2015-01-21 ENCOUNTER — Telehealth: Payer: Self-pay | Admitting: Family Medicine

## 2015-01-21 NOTE — Telephone Encounter (Signed)
Patient was notified of results.  

## 2015-01-21 NOTE — Telephone Encounter (Signed)
-----   Message from Cameron Sprang, MD sent at 01/20/2015  1:16 PM EDT ----- Pls let her know EEG is normal, thanks

## 2015-02-03 ENCOUNTER — Ambulatory Visit (INDEPENDENT_AMBULATORY_CARE_PROVIDER_SITE_OTHER): Payer: Medicare Other | Admitting: Neurology

## 2015-02-03 DIAGNOSIS — R404 Transient alteration of awareness: Secondary | ICD-10-CM | POA: Diagnosis not present

## 2015-02-09 ENCOUNTER — Other Ambulatory Visit: Payer: Self-pay | Admitting: Podiatry

## 2015-02-15 ENCOUNTER — Ambulatory Visit (INDEPENDENT_AMBULATORY_CARE_PROVIDER_SITE_OTHER): Payer: Medicare Other | Admitting: Neurology

## 2015-02-15 ENCOUNTER — Encounter: Payer: Self-pay | Admitting: Neurology

## 2015-02-15 VITALS — BP 132/74 | HR 89 | Resp 16 | Ht 67.0 in | Wt 192.0 lb

## 2015-02-15 DIAGNOSIS — R05 Cough: Secondary | ICD-10-CM | POA: Insufficient documentation

## 2015-02-15 DIAGNOSIS — R404 Transient alteration of awareness: Secondary | ICD-10-CM

## 2015-02-15 DIAGNOSIS — R55 Syncope and collapse: Secondary | ICD-10-CM

## 2015-02-15 NOTE — Patient Instructions (Signed)
1. Follow-up with your primary care doctor, call our office for any change in symptoms
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
## 2015-02-17 NOTE — Procedures (Signed)
ELECTROENCEPHALOGRAM REPORT  Dates of Recording: 02/03/2015 to 02/04/2015  Patient's Name: [name] MRN: [id number] Date of Birth: [birth year]  Referring Provider: Dr. Ellouise Newer  Procedure: 24-hour ambulatory EEG  History: This is a 64 year old woman with episodes of blacking out and falls. EEG for classification.  Medications: Neurontin, Prolixin, Zoloft  Technical Summary: This is a 24-hour multichannel digital EEG recording measured by the international 10-20 system with electrodes applied with paste and impedances below 5000 ohms performed as portable with EKG monitoring.  The digital EEG was referentially recorded, reformatted, and digitally filtered in a variety of bipolar and referential montages for optimal display.    DESCRIPTION OF RECORDING: During maximal wakefulness, the background activity consisted of a symmetric 9 Hz posterior dominant rhythm which was reactive to eye opening.  There were no epileptiform discharges or focal slowing seen in wakefulness.  During the recording, the patient progresses through wakefulness, drowsiness, and Stage 2 sleep.  Again, there were no epileptiform discharges seen.  Events: There were no push button events. Patient did not report typical events.  There were no electrographic seizures seen.  EKG lead was unremarkable.  IMPRESSION: This 24-hour ambulatory EEG study is normal.    CLINICAL CORRELATION: A normal EEG does not exclude a clinical diagnosis of epilepsy.  Typical events were not captured. If further clinical questions remain, inpatient video EEG monitoring may be helpful.   Ellouise Newer, M.D.

## 2015-02-25 ENCOUNTER — Ambulatory Visit: Payer: Medicare PPO | Admitting: Podiatry

## 2015-04-07 ENCOUNTER — Telehealth: Payer: Self-pay | Admitting: Neurology

## 2015-04-07 NOTE — Telephone Encounter (Signed)
Pt [name] DOB; [birth year] called/[phone] about back surg. Info?

## 2015-04-07 NOTE — Telephone Encounter (Signed)
Lmovm to return my call. 

## 2015-04-07 NOTE — Telephone Encounter (Signed)
Patient returned my call. She was wanting to set up an appt to see Dr. Delice Lesch to talk about getting back surgery scheduled. I explained to patient that Dr. Delice Lesch doesn't do any type of surgery.

## 2015-04-20 ENCOUNTER — Other Ambulatory Visit: Payer: Medicare Other

## 2015-04-20 ENCOUNTER — Ambulatory Visit (INDEPENDENT_AMBULATORY_CARE_PROVIDER_SITE_OTHER): Payer: Medicare Other | Admitting: Podiatry

## 2015-04-20 ENCOUNTER — Encounter: Payer: Self-pay | Admitting: Podiatry

## 2015-04-20 DIAGNOSIS — M2040 Other hammer toe(s) (acquired), unspecified foot: Secondary | ICD-10-CM

## 2015-04-20 DIAGNOSIS — M2011 Hallux valgus (acquired), right foot: Secondary | ICD-10-CM

## 2015-04-20 DIAGNOSIS — Q828 Other specified congenital malformations of skin: Secondary | ICD-10-CM | POA: Diagnosis not present

## 2015-04-20 DIAGNOSIS — M79673 Pain in unspecified foot: Secondary | ICD-10-CM

## 2015-04-20 DIAGNOSIS — B351 Tinea unguium: Secondary | ICD-10-CM

## 2015-04-20 DIAGNOSIS — E1140 Type 2 diabetes mellitus with diabetic neuropathy, unspecified: Secondary | ICD-10-CM

## 2015-04-21 NOTE — Progress Notes (Signed)
She presents today with chief complaint of painful elongated toenails with corns and calluses. She says she like to consider surgical intervention regarding the second toe and the bunion deformity of the right foot. She also like to consider diabetic shoes.  Objective: Vital signs are stable she's alert and oriented 3. Pulses are palpable. Decreased sensorium percent was a monofilament with hammertoes bilateral. She has hallux abductovalgus form in the right foot. No ulcerations noted. Also give lesions. Porokeratotic lesions plantar aspect of the bilateral foot.  Assessment: Diabetic peripheral neuropathy bilateral. Hallux valgus deformity right foot. Porokeratosis bilateral. Pain limb second and onychomycosis.  Plan: Debridement of nails 1 through 5 bilateral. Debridement of all reactive hyperkeratosis bilateral. She was scanned in size today for diabetic shoes and I will follow-up with her for further discussion on her bunion repair.

## 2015-04-23 ENCOUNTER — Emergency Department (HOSPITAL_COMMUNITY): Payer: Medicare Other

## 2015-04-23 ENCOUNTER — Encounter (HOSPITAL_COMMUNITY): Payer: Self-pay | Admitting: Emergency Medicine

## 2015-04-23 ENCOUNTER — Emergency Department (HOSPITAL_COMMUNITY)
Admission: EM | Admit: 2015-04-23 | Discharge: 2015-04-23 | Disposition: A | Discharge status: Home / Self Care | Payer: Medicare Other | Attending: Emergency Medicine | Admitting: Emergency Medicine

## 2015-04-23 DIAGNOSIS — Z720 Tobacco use: Secondary | ICD-10-CM | POA: Insufficient documentation

## 2015-04-23 DIAGNOSIS — Z7951 Long term (current) use of inhaled steroids: Secondary | ICD-10-CM | POA: Diagnosis not present

## 2015-04-23 DIAGNOSIS — E119 Type 2 diabetes mellitus without complications: Secondary | ICD-10-CM | POA: Diagnosis not present

## 2015-04-23 DIAGNOSIS — Z79899 Other long term (current) drug therapy: Secondary | ICD-10-CM | POA: Diagnosis not present

## 2015-04-23 DIAGNOSIS — J45909 Unspecified asthma, uncomplicated: Secondary | ICD-10-CM | POA: Insufficient documentation

## 2015-04-23 DIAGNOSIS — W1839XA Other fall on same level, initial encounter: Secondary | ICD-10-CM | POA: Insufficient documentation

## 2015-04-23 DIAGNOSIS — W19XXXA Unspecified fall, initial encounter: Secondary | ICD-10-CM

## 2015-04-23 DIAGNOSIS — Y998 Other external cause status: Secondary | ICD-10-CM | POA: Insufficient documentation

## 2015-04-23 DIAGNOSIS — R110 Nausea: Secondary | ICD-10-CM | POA: Insufficient documentation

## 2015-04-23 DIAGNOSIS — Y9289 Other specified places as the place of occurrence of the external cause: Secondary | ICD-10-CM | POA: Insufficient documentation

## 2015-04-23 DIAGNOSIS — S199XXA Unspecified injury of neck, initial encounter: Secondary | ICD-10-CM | POA: Insufficient documentation

## 2015-04-23 DIAGNOSIS — Z794 Long term (current) use of insulin: Secondary | ICD-10-CM | POA: Insufficient documentation

## 2015-04-23 DIAGNOSIS — M1990 Unspecified osteoarthritis, unspecified site: Secondary | ICD-10-CM | POA: Insufficient documentation

## 2015-04-23 DIAGNOSIS — G40909 Epilepsy, unspecified, not intractable, without status epilepticus: Secondary | ICD-10-CM | POA: Insufficient documentation

## 2015-04-23 DIAGNOSIS — H538 Other visual disturbances: Secondary | ICD-10-CM | POA: Insufficient documentation

## 2015-04-23 DIAGNOSIS — M542 Cervicalgia: Secondary | ICD-10-CM

## 2015-04-23 DIAGNOSIS — Y9389 Activity, other specified: Secondary | ICD-10-CM | POA: Insufficient documentation

## 2015-04-23 DIAGNOSIS — F329 Major depressive disorder, single episode, unspecified: Secondary | ICD-10-CM | POA: Insufficient documentation

## 2015-04-23 DIAGNOSIS — I10 Essential (primary) hypertension: Secondary | ICD-10-CM | POA: Diagnosis not present

## 2015-04-23 MED ORDER — ONDANSETRON 4 MG PO TBDP
4.0000 mg | ORAL_TABLET | Freq: Once | ORAL | Status: AC | PRN
  Administered 2015-04-23: 4 mg via ORAL
  Filled 2015-04-23: qty 1

## 2015-04-23 NOTE — ED Notes (Signed)
Per EMS pt rail broke resulting in fall; complaint of left side pain; ambulatory with EMS.

## 2015-04-23 NOTE — Discharge Instructions (Signed)
Cervical Sprain A cervical sprain is when the tissues (ligaments) that hold the neck bones in place stretch or tear. HOME CARE   Put ice on the injured area.  Put ice in a plastic bag.  Place a towel between your skin and the bag.  Leave the ice on for 15-20 minutes, 3-4 times a day.  You may have been given a collar to wear. This collar keeps your neck from moving while you heal.  Do not take the collar off unless told by your doctor.  If you have long hair, keep it outside of the collar.  Ask your doctor before changing the position of your collar. You may need to change its position over time to make it more comfortable.  If you are allowed to take off the collar for cleaning or bathing, follow your doctor's instructions on how to do it safely.  Keep your collar clean by wiping it with mild soap and water. Dry it completely. If the collar has removable pads, remove them every 1-2 days to hand wash them with soap and water. Allow them to air dry. They should be dry before you wear them in the collar.  Do not drive while wearing the collar.  Only take medicine as told by your doctor.  Keep all doctor visits as told.  Keep all physical therapy visits as told.  Adjust your work station so that you have good posture while you work.  Avoid positions and activities that make your problems worse.  Warm up and stretch before being active. GET HELP IF:  Your pain is not controlled with medicine.  You cannot take less pain medicine over time as planned.  Your activity level does not improve as expected. GET HELP RIGHT AWAY IF:   You are bleeding.  Your stomach is upset.  You have an allergic reaction to your medicine.  You develop new problems that you cannot explain.  You lose feeling (become numb) or you cannot move any part of your body (paralysis).  You have tingling or weakness in any part of your body.  Your symptoms get worse. Symptoms include:  Pain,  soreness, stiffness, puffiness (swelling), or a burning feeling in your neck.  Pain when your neck is touched.  Shoulder or upper back pain.  Limited ability to move your neck.  Headache.  Dizziness.  Your hands or arms feel week, lose feeling, or tingle.  Muscle spasms.  Difficulty swallowing or chewing. MAKE SURE YOU:   Understand these instructions.  Will watch your condition.  Will get help right away if you are not doing well or get worse. Document Released: 02/14/2008 Document Revised: 04/30/2013 Document Reviewed: 03/05/2013 [facility] Patient Information 2015 [place], [place]. This information is not intended to replace advice given to you by your health care provider. Make sure you discuss any questions you have with your health care provider. The x-ray of your neck shows that you have degenerative disc disease in your neck but no herniation.  Please make an appointment with your primary care physician for follow-up.  Please be careful walking, Roger home

## 2015-04-23 NOTE — ED Provider Notes (Signed)
CSN: [id number]     Arrival date & time 04/23/15  1831 History  This chart was scribed for non-physician practitioner Junius Creamer, NP working with Wandra Arthurs, MD by Zola Button, ED Scribe. This patient was seen in room WTR8/WTR8 and the patient's care was started at 8:05 PM.      Chief Complaint  Patient presents with  . Left Side Pain    The history is provided by the patient.   HPI Comments: [name] is a 64 y.o. female who presents to the Emergency Department complaining of left sided neck pain secondary to a fall around 4:30 PM today. Patient states she landed onto the ground onto her buttocks and back. She states she has fallen multiple times the past week. She notes falling 5 days ago onto her buttocks while cleaning around a condo. Patient has taken 50 mg tramadol today. She reports having some associated dizziness, lightheadedness, and blurred vision today following the fall that has resolved.  She has chronic nausea at baseline. PMHx includes seizures, hypertension,GERD, arthritis, hyperlipidemia and depression.    Past Medical History  Diagnosis Date  . Asthma   . Hypertension   . Arthritis   . Diabetes mellitus     type II  . Allergy   . Depression   . Hyperlipidemia   . Seizures    Past Surgical History  Procedure Laterality Date  . Foot surgery Bilateral 2001    x3  . Salpingoophorectomy Left   . Tubal ligation    . Cystectomy    . Appendectomy     Family History  Problem Relation Age of Onset  . Heart disease Mother   . Diabetes Mother   . Depression Mother   . Cancer Father   . Diabetes Father   . Depression Father   . Diabetes Sister   . Kidney disease Brother   . Diabetes Brother   . Kidney disease Brother   . Diabetes Brother   . Diabetes Brother   . Diabetes Brother    Social History  Substance Use Topics  . Smoking status: Current Every Day Smoker -- 0.50 packs/day  . Smokeless tobacco: Never Used     Comment: 3-5 ciggarettes a day  .  Alcohol Use: 0.0 oz/week    0 Standard drinks or equivalent per week     Comment: occassional glass of wine   OB History    Gravida Para Term Preterm AB TAB SAB Ectopic Multiple Living   4 2 2  2  2   2      Review of Systems  Constitutional: Negative for activity change and appetite change.  Respiratory: Negative for shortness of breath.   Cardiovascular: Negative for chest pain.  Gastrointestinal: Positive for nausea. Negative for vomiting.  Musculoskeletal: Positive for neck pain. Negative for neck stiffness.  Skin: Negative for pallor.  All other systems reviewed and are negative.     Allergies  Aspirin; Other; and Chocolate  Home Medications   Prior to Admission medications   Medication Sig Start Date End Date Taking? Authorizing Provider  albuterol (PROAIR HFA) 108 (90 BASE) MCG/ACT inhaler Inhale 2 puffs into the lungs 3 (three) times daily.    Historical Provider, MD  amLODipine (NORVASC) 10 MG tablet Take 10 mg by mouth daily.    Historical Provider, MD  clindamycin (CLEOCIN T) 1 % lotion Apply topically 2 (two) times daily.  11/18/14   Historical Provider, MD  clobetasol ointment (TEMOVATE) 0.05 % Apply 1  application topically 2 (two) times daily.  11/18/14   Historical Provider, MD  fluPHENAZine (PROLIXIN) 2.5 MG tablet Take 2.5 mg by mouth daily.    Historical Provider, MD  fluticasone (FLONASE) 50 MCG/ACT nasal spray Place 2 sprays into the nose daily.  12/10/14   Historical Provider, MD  gabapentin (NEURONTIN) 800 MG tablet Take 800 mg by mouth 3 (three) times daily.    Historical Provider, MD  insulin glargine (LANTUS) 100 UNIT/ML injection Inject 50 Units into the skin at bedtime.     Historical Provider, MD  lisinopril-hydrochlorothiazide (PRINZIDE,ZESTORETIC) 20-25 MG per tablet Take 1 tablet by mouth daily.    Historical Provider, MD  Magnesium Hydroxide (MILK OF MAGNESIA PO) Take 15 mLs by mouth daily as needed (constipation).    Historical Provider, MD  metFORMIN  (GLUCOPHAGE) 1000 MG tablet Take 1,000 mg by mouth 2 (two) times daily with a meal.    Historical Provider, MD  omeprazole (PRILOSEC) 40 MG capsule Take 40 mg by mouth 2 (two) times daily.  11/14/14   Historical Provider, MD  sertraline (ZOLOFT) 50 MG tablet Take 50 mg by mouth daily.    Historical Provider, MD   BP 147/125 mmHg  Pulse 91  Temp(Src) 98.1 F (36.7 C) (Oral)  Resp 18  SpO2 94% Physical Exam  Constitutional: She is oriented to person, place, and time. She appears well-developed and well-nourished.  HENT:  Head: Normocephalic.  Right Ear: External ear normal.  Left Ear: External ear normal.  Mouth/Throat: Oropharynx is clear and moist.  Eyes: Pupils are equal, round, and reactive to light.  Neck: Normal range of motion. Muscular tenderness present. No spinous process tenderness present. Normal range of motion present.  Cardiovascular: Normal rate.   Abdominal: Soft.  Musculoskeletal: She exhibits no edema or tenderness.  Lymphadenopathy:    She has no cervical adenopathy.  Neurological: She is alert and oriented to person, place, and time.  Skin: Skin is warm. No rash noted.  Nursing note and vitals reviewed.   ED Course  Procedures  DIAGNOSTIC STUDIES: Oxygen Saturation is 94% on room air, adequate by my interpretation.    COORDINATION OF CARE: 8:12 PM-Discussed treatment plan which includes XR neck with patient/guardian at bedside and patient/guardian agreed to plan.    Labs Review Labs Reviewed - No data to display  Imaging Review Dg Cervical Spine Complete  04/23/2015   CLINICAL DATA:  Multiple falls.  Posterior left neck pain.  EXAM: CERVICAL SPINE  4+ VIEWS  COMPARISON:  None.  FINDINGS: The cervical spine is visualized from the skullbase through the cervical thoracic junction. Vertebral body heights are maintained. There straightening of the normal cervical lordosis. Uncovertebral spurring is present with osseous foraminal narrowing most prominent at C5-6  and C6-7, left greater than right. No acute fracture or traumatic subluxation is present. The lung apices are clear.  IMPRESSION: 1. Moderate degenerative changes in the lower lumbar spine, most evident at C5-6 and C6-7. 2. No acute abnormality.   Electronically Signed   By: San Morelle M.D.   On: 04/23/2015 21:30   I personally reviewed and evaluated these images and lab results as part of my medical decision-making.   EKG Interpretation None      MDM   Final diagnoses:  Fall, initial encounter  Neck pain on left side    I personally performed the services described in this documentation, which was scribed in my presence. The recorded information has been reviewed and is accurate.   Baker Janus  Olean Ree, NP 04/23/15 2143  Wandra Arthurs, MD 04/23/15 [phone]

## 2015-06-03 ENCOUNTER — Other Ambulatory Visit: Payer: Self-pay

## 2015-06-03 ENCOUNTER — Other Ambulatory Visit: Payer: Self-pay | Admitting: Specialist

## 2015-06-03 DIAGNOSIS — Z1231 Encounter for screening mammogram for malignant neoplasm of breast: Secondary | ICD-10-CM

## 2015-08-13 ENCOUNTER — Other Ambulatory Visit: Payer: Self-pay | Admitting: Podiatry

## 2015-08-24 ENCOUNTER — Ambulatory Visit: Payer: Medicare Other

## 2015-08-30 ENCOUNTER — Ambulatory Visit
Admission: RE | Admit: 2015-08-30 | Discharge: 2015-08-30 | Disposition: A | Discharge status: Home / Self Care | Payer: Medicare Other | Source: Ambulatory Visit

## 2015-08-30 ENCOUNTER — Ambulatory Visit (HOSPITAL_COMMUNITY): Payer: Medicare Other

## 2015-08-30 DIAGNOSIS — Z1231 Encounter for screening mammogram for malignant neoplasm of breast: Secondary | ICD-10-CM

## 2015-09-23 ENCOUNTER — Ambulatory Visit (INDEPENDENT_AMBULATORY_CARE_PROVIDER_SITE_OTHER): Payer: Medicare Other | Admitting: Podiatry

## 2015-09-23 DIAGNOSIS — E1140 Type 2 diabetes mellitus with diabetic neuropathy, unspecified: Secondary | ICD-10-CM

## 2015-09-23 DIAGNOSIS — M2011 Hallux valgus (acquired), right foot: Secondary | ICD-10-CM

## 2015-09-23 DIAGNOSIS — Q828 Other specified congenital malformations of skin: Secondary | ICD-10-CM | POA: Diagnosis not present

## 2015-09-23 DIAGNOSIS — M2040 Other hammer toe(s) (acquired), unspecified foot: Secondary | ICD-10-CM | POA: Diagnosis not present

## 2015-09-23 NOTE — Progress Notes (Signed)
Patient ID: [name], female   DOB: 05-08-1951, 65 y.o.   MRN: [id number] Patient presents for diabetic shoe pick up, shoes are tried on for good fit.  Patient received 1 Pair Orthofeet [address] [place] grey in women's size 10 extra wide and 3 pairs custom molded diabetic inserts.  Verbal and written break in and wear instructions given.  Patient will follow up for scheduled routine care.

## 2015-09-23 NOTE — Patient Instructions (Signed)

## 2015-10-11 ENCOUNTER — Telehealth: Payer: Self-pay | Admitting: Pulmonary Disease

## 2015-10-11 NOTE — Telephone Encounter (Signed)
NO ANSWER/LMTCB IF SHE NEEDS TO CANCEL

## 2015-10-12 ENCOUNTER — Ambulatory Visit: Payer: Medicare Other

## 2015-10-12 ENCOUNTER — Ambulatory Visit (INDEPENDENT_AMBULATORY_CARE_PROVIDER_SITE_OTHER): Payer: Medicare Other | Admitting: Internal Medicine

## 2015-10-12 ENCOUNTER — Encounter: Payer: Self-pay | Admitting: Internal Medicine

## 2015-10-12 VITALS — BP 162/87 | HR 82 | Temp 98.2°F | Wt 202.0 lb

## 2015-10-12 DIAGNOSIS — I10 Essential (primary) hypertension: Secondary | ICD-10-CM

## 2015-10-12 DIAGNOSIS — Z0000 Encounter for general adult medical examination without abnormal findings: Secondary | ICD-10-CM

## 2015-10-12 DIAGNOSIS — Z7984 Long term (current) use of oral hypoglycemic drugs: Secondary | ICD-10-CM | POA: Diagnosis not present

## 2015-10-12 DIAGNOSIS — E1142 Type 2 diabetes mellitus with diabetic polyneuropathy: Secondary | ICD-10-CM | POA: Diagnosis not present

## 2015-10-12 DIAGNOSIS — E119 Type 2 diabetes mellitus without complications: Secondary | ICD-10-CM

## 2015-10-12 DIAGNOSIS — Z794 Long term (current) use of insulin: Secondary | ICD-10-CM | POA: Diagnosis not present

## 2015-10-12 LAB — POCT GLYCOSYLATED HEMOGLOBIN (HGB A1C): HEMOGLOBIN A1C: 6.9

## 2015-10-12 LAB — GLUCOSE, CAPILLARY: Glucose-Capillary: 99 mg/dL (ref 65–99)

## 2015-10-12 NOTE — Assessment & Plan Note (Signed)
HPI: takes amlodipine 10mg , lisinopril-hctz 20-25mg  daily  A: Essential HTN, uncontrolled  P: - Patient is a bit of a poor historian, her BP is not well controlled today but in EPIC has been beter controlled in the past. - WIll have her continue her current regimen and follow up in 1 month.

## 2015-10-12 NOTE — Patient Instructions (Signed)
General Instructions:  Please get the battery replaced on your glucometer  Please bring your medicines with you each time you come to clinic.  Medicines may include prescription medications, over-the-counter medications, herbal remedies, eye drops, vitamins, or other pills.   Progress Toward Treatment Goals:  Treatment Goal 10/12/2015  Hemoglobin A1C at goal  Blood pressure unable to assess  Stop smoking smoking the same amount    Self Care Goals & Plans:  Self Care Goal 10/12/2015  Manage my medications take my medicines as prescribed  Stop smoking cut down the number of cigarettes smoked    Home Blood Glucose Monitoring 10/12/2015  Check my blood sugar 2 times a day  When to check my blood sugar before breakfast; before dinner     Care Management & Community Referrals:  Referral 10/12/2015  Referrals made for care management support none needed

## 2015-10-12 NOTE — Progress Notes (Signed)
[facility] Subjective:   Patient ID: [name] female   DOB: 05/08/1951 65 y.o.   MRN: [id number]  HPI: [name] is a 65 y.o. female with a PMH detailed below who presents to establish care for DM and HTN.  Please see problem based charting below for the status of her chronic medical problems.    Past Medical History  Diagnosis Date  . Asthma   . Hypertension   . Arthritis   . Diabetes mellitus     type II  . Allergy   . Depression   . Hyperlipidemia   . Seizures ([place])    Current Outpatient Prescriptions  Medication Sig Dispense Refill  . amLODipine (NORVASC) 10 MG tablet Take 10 mg by mouth daily.    . clobetasol ointment (TEMOVATE) AB-123456789 % Apply 1 application topically 2 (two) times daily.   3  . fluPHENAZine (PROLIXIN) 2.5 MG tablet Take 2.5 mg by mouth daily.    Marland Kitchen gabapentin (NEURONTIN) 800 MG tablet TAKE 1 TABLET BY MOUTH THREE TIMES DAILY 90 tablet 7  . insulin glargine (LANTUS) 100 UNIT/ML injection Inject 50 Units into the skin at bedtime.     Marland Kitchen lisinopril-hydrochlorothiazide (PRINZIDE,ZESTORETIC) 20-25 MG per tablet Take 1 tablet by mouth daily.    . metFORMIN (GLUCOPHAGE) 1000 MG tablet Take 1,000 mg by mouth 2 (two) times daily with a meal.     No current facility-administered medications for this visit.   Family History  Problem Relation Age of Onset  . Heart disease Mother   . Diabetes Mother   . Depression Mother   . Cancer Father   . Diabetes Father   . Depression Father   . Diabetes Sister   . Kidney disease Brother   . Diabetes Brother   . Kidney disease Brother   . Diabetes Brother   . Diabetes Brother   . Diabetes Brother    Social History   Social History  . Marital Status: Single    Spouse Name: N/A  . Number of Children: N/A  . Years of Education: N/A   Social History Main Topics  . Smoking status: Current Every Day Smoker -- 1.00 packs/day    Types: Cigarettes  . Smokeless tobacco: Never Used   Comment: 3-5 ciggarettes a day  . Alcohol Use: 0.0 oz/week    0 Standard drinks or equivalent per week     Comment: occassional glass of wine  . Drug Use: No  . Sexual Activity: Not Currently   Other Topics Concern  . None   Social History Narrative   Review of Systems: Review of Systems  Constitutional: Negative for fever and chills.  Respiratory: Negative for shortness of breath.   Cardiovascular: Negative for chest pain.  Genitourinary: Negative for frequency.  Neurological: Negative for tingling and sensory change.  Endo/Heme/Allergies: Negative for polydipsia.  All other systems reviewed and are negative.   Objective:  Physical Exam: Filed Vitals:   10/12/15 1549  BP: 162/87  Pulse: 82  Temp: 98.2 F (36.8 C)  TempSrc: Oral  Weight: 202 lb (91.627 kg)  SpO2: 99%  Physical Exam  Constitutional: She is well-developed, well-nourished, and in no distress.  HENT:  Head: Normocephalic and atraumatic.  Cardiovascular: Normal rate, regular rhythm and intact distal pulses.   Pulmonary/Chest: Effort normal and breath sounds normal.  Abdominal: Soft. Bowel sounds are normal.  Musculoskeletal: She exhibits no edema.  Nursing note and vitals reviewed.   Assessment & Plan:  Case discussed  with Dr. Lynnae January  Hypertension HPI: takes amlodipine 10mg , lisinopril-hctz 20-25mg  daily  A: Essential HTN, uncontrolled  P: - Patient is a bit of a poor historian, her BP is not well controlled today but in EPIC has been beter controlled in the past. - WIll have her continue her current regimen and follow up in 1 month.  DM type 2 (diabetes mellitus, type 2) HPI:  She was diagnosed in 2002.  Patient reports she is taking metformin 1g bid, Lantus 50 units QHS.  She is not currently taking her CBG at home as her battery is dead.  She plans to get this replaced soon.  She previously reports she had some pain in her feet from DM and tages gabapentin for this.  She has never had a diabetic  foot ulcer.  She does check her feet every night.   She reports her sugars have been well controlled and denies any polyuria or polydipsia. Her last eye exam was in Jan 2016 and showed no DR.  A: Type 2 DM, controlled with peripheral neuropathy.  P: - Continue metformin and Lantus 50units QHS - Encouraged annual eye exam     Medications Ordered No orders of the defined types were placed in this encounter.   Other Orders Orders Placed This Encounter  Procedures  . Glucose, capillary  . POCT HgB A1C (CPT 83036)  . POC Hemoccult Bld/Stl (3-Cd Home Screen)    Standing Status: Future     Number of Occurrences:      Standing Expiration Date: 10/11/2016   Follow Up: Return in about 4 weeks (around 11/09/2015).

## 2015-10-12 NOTE — Assessment & Plan Note (Signed)
HPI:  She was diagnosed in 2002.  Patient reports she is taking metformin 1g bid, Lantus 50 units QHS.  She is not currently taking her CBG at home as her battery is dead.  She plans to get this replaced soon.  She previously reports she had some pain in her feet from DM and tages gabapentin for this.  She has never had a diabetic foot ulcer.  She does check her feet every night.   She reports her sugars have been well controlled and denies any polyuria or polydipsia. Her last eye exam was in Jan 2016 and showed no DR.  A: Type 2 DM, controlled with peripheral neuropathy.  P: - Continue metformin and Lantus 50units QHS - Encouraged annual eye exam

## 2015-10-17 NOTE — Progress Notes (Signed)
Internal Medicine Clinic Attending  Case discussed with Dr. Hoffman soon after the resident saw the patient.  We reviewed the resident's history and exam and pertinent patient test results.  I agree with the assessment, diagnosis, and plan of care documented in the resident's note. 

## 2015-10-18 ENCOUNTER — Ambulatory Visit: Payer: Medicare Other

## 2015-11-08 ENCOUNTER — Ambulatory Visit: Payer: Medicare Other | Admitting: Internal Medicine

## 2015-11-08 ENCOUNTER — Encounter: Payer: Self-pay | Admitting: Internal Medicine

## 2015-11-08 VITALS — BP 139/75 | HR 82 | Temp 98.5°F | Wt 207.5 lb

## 2015-11-08 DIAGNOSIS — E11649 Type 2 diabetes mellitus with hypoglycemia without coma: Secondary | ICD-10-CM | POA: Diagnosis not present

## 2015-11-08 DIAGNOSIS — I10 Essential (primary) hypertension: Secondary | ICD-10-CM

## 2015-11-08 DIAGNOSIS — M545 Low back pain, unspecified: Secondary | ICD-10-CM

## 2015-11-08 DIAGNOSIS — M546 Pain in thoracic spine: Secondary | ICD-10-CM | POA: Insufficient documentation

## 2015-11-08 DIAGNOSIS — Z794 Long term (current) use of insulin: Secondary | ICD-10-CM

## 2015-11-08 DIAGNOSIS — Z7984 Long term (current) use of oral hypoglycemic drugs: Secondary | ICD-10-CM

## 2015-11-08 DIAGNOSIS — Z79899 Other long term (current) drug therapy: Secondary | ICD-10-CM

## 2015-11-08 LAB — GLUCOSE, CAPILLARY: Glucose-Capillary: 72 mg/dL (ref 65–99)

## 2015-11-08 MED ORDER — CYCLOBENZAPRINE HCL 5 MG PO TABS: 5.0000 mg | ORAL_TABLET | Freq: Three times a day (TID) | ORAL | Status: DC | PRN

## 2015-11-08 NOTE — Assessment & Plan Note (Signed)
HPI: She has been taking metformin 1g BID and Lantus 50units QHS (her previous dosing).  She brings a handwritten log of AM blood sugars (her meter was unable to be downloaded today) this is notable for a reading of 40, 58 and 60.  She reports during these low sugars she gets very fatigued.  Otherwise she feels she is doing very well.  A: Controlled Type 2 DM with hypoglycemia  P: -Reduce Lantus to 30units QHS -Continue Metformin - Call office if further hypoepisdoes before next visit -Monitor weight, may consider GLP-1 agonist in future especially if weight continues to increase or consider swapping for Lantus.

## 2015-11-08 NOTE — Assessment & Plan Note (Signed)
HPI: Reports occasional low back pain and spasms, has benefited from valium in the past and wants a refill of this medication.  A: Back pain  P: Do not see much evidence of muscle spasm of back today, will hold off BZD use but I will try a low dose of flexeril PRN to see if this helps.

## 2015-11-08 NOTE — Assessment & Plan Note (Signed)
HPI: She is taking Amlodipine 10mg , Lisinopril -HCTZ 20-25mg  daily. She does have long standing lower extremity edema but otherwise has no complaints  A: Essential HTN, at goal  P: Discussed that her lower extremity edema may be contributed to by amlodipine however she is doing great with her blood pressure and I am hesitant to change her medications now if it is not bothersome.  She is in agreement.  Can use compression stockings if needed.

## 2015-11-08 NOTE — Progress Notes (Signed)
[facility] Subjective:   Patient ID: [name] female   DOB: 06-17-1951 65 y.o.   MRN: [id number]  HPI: [name] is a 65 y.o. female with a PMH detailed below who presents for 1 month follow up of DM and HTN.  Please see problem based charting below for the status of her chronic medical problems.    Past Medical History  Diagnosis Date  . Asthma   . Hypertension   . Arthritis   . Diabetes mellitus     type II  . Allergy   . Depression   . Hyperlipidemia   . Seizures ([place])    Current Outpatient Prescriptions  Medication Sig Dispense Refill  . amLODipine (NORVASC) 10 MG tablet Take 10 mg by mouth daily.    . clobetasol ointment (TEMOVATE) AB-123456789 % Apply 1 application topically 2 (two) times daily.   3  . fluPHENAZine (PROLIXIN) 2.5 MG tablet Take 2.5 mg by mouth daily.    Marland Kitchen gabapentin (NEURONTIN) 800 MG tablet TAKE 1 TABLET BY MOUTH THREE TIMES DAILY 90 tablet 7  . insulin glargine (LANTUS) 100 UNIT/ML injection Inject 50 Units into the skin at bedtime.     Marland Kitchen lisinopril-hydrochlorothiazide (PRINZIDE,ZESTORETIC) 20-25 MG per tablet Take 1 tablet by mouth daily.    . metFORMIN (GLUCOPHAGE) 1000 MG tablet Take 1,000 mg by mouth 2 (two) times daily with a meal.     No current facility-administered medications for this visit.   Family History  Problem Relation Age of Onset  . Heart disease Mother   . Diabetes Mother   . Depression Mother   . Cancer Father   . Diabetes Father   . Depression Father   . Diabetes Sister   . Kidney disease Brother   . Diabetes Brother   . Kidney disease Brother   . Diabetes Brother   . Diabetes Brother   . Diabetes Brother    Social History   Social History  . Marital Status: Single    Spouse Name: N/A  . Number of Children: N/A  . Years of Education: N/A   Social History Main Topics  . Smoking status: Current Every Day Smoker -- 1.00 packs/day    Types: Cigarettes  . Smokeless tobacco: Never Used      Comment: 3-5 ciggarettes a day  . Alcohol Use: 0.0 oz/week    0 Standard drinks or equivalent per week     Comment: occassional glass of wine  . Drug Use: No  . Sexual Activity: Not Currently   Other Topics Concern  . None   Social History Narrative   Review of Systems: Review of Systems  Eyes: Negative for blurred vision.  Cardiovascular: Positive for leg swelling (chronic).  Gastrointestinal: Negative for abdominal pain.  Genitourinary: Negative for dysuria and frequency.  Neurological: Negative for headaches.     Objective:  Physical Exam: Filed Vitals:   11/08/15 0906  BP: 139/75  Pulse: 82  Temp: 98.5 F (36.9 C)  TempSrc: Oral  Weight: 207 lb 8 oz (94.121 kg)  SpO2: 97%   Physical Exam  Constitutional: She is well-developed, well-nourished, and in no distress.  Cardiovascular: Normal rate and regular rhythm.   Pulmonary/Chest: Effort normal and breath sounds normal.  Abdominal: Soft. Bowel sounds are normal. She exhibits no distension.  Musculoskeletal: She exhibits edema (1+ pedal edema on left 1-2+ edema on right).       Lumbar back: She exhibits normal range of motion, no tenderness, no bony  tenderness, no swelling and no spasm.  Nursing note and vitals reviewed.   Assessment & Plan:  Case discussed with Dr. Lynnae January  Hypertension HPI: She is taking Amlodipine 10mg , Lisinopril -HCTZ 20-25mg  daily. She does have long standing lower extremity edema but otherwise has no complaints  A: Essential HTN, at goal  P: Discussed that her lower extremity edema may be contributed to by amlodipine however she is doing great with her blood pressure and I am hesitant to change her medications now if it is not bothersome.  She is in agreement.  Can use compression stockings if needed.  DM type 2 (diabetes mellitus, type 2) HPI: She has been taking metformin 1g BID and Lantus 50units QHS (her previous dosing).  She brings a handwritten log of AM blood sugars (her meter  was unable to be downloaded today) this is notable for a reading of 40, 58 and 60.  She reports during these low sugars she gets very fatigued.  Otherwise she feels she is doing very well.  A: Controlled Type 2 DM with hypoglycemia  P: -Reduce Lantus to 30units QHS -Continue Metformin - Call office if further hypoepisdoes before next visit -Monitor weight, may consider GLP-1 agonist in future especially if weight continues to increase or consider swapping for Lantus.  Back pain of thoracolumbar region HPI: Reports occasional low back pain and spasms, has benefited from valium in the past and wants a refill of this medication.  A: Back pain  P: Do not see much evidence of muscle spasm of back today, will hold off BZD use but I will try a low dose of flexeril PRN to see if this helps.    Medications Ordered Meds ordered this encounter  Medications  . cyclobenzaprine (FLEXERIL) 5 MG tablet    Sig: Take 1 tablet (5 mg total) by mouth every 8 (eight) hours as needed for muscle spasms.    Dispense:  30 tablet    Refill:  1   Other Orders Orders Placed This Encounter  Procedures  . Glucose, capillary   Follow Up: Return in about 1 month (around 12/06/2015).

## 2015-11-08 NOTE — Patient Instructions (Signed)
General Instructions:  I want you to decrease the Lantus to 30units at bedtime. Keep checking the sugars, call if they are low again or stay too high.  Otherwise I will see you in one month. Please bring your medicines with you each time you come to clinic.  Medicines may include prescription medications, over-the-counter medications, herbal remedies, eye drops, vitamins, or other pills.   Progress Toward Treatment Goals:  Treatment Goal 10/12/2015  Hemoglobin A1C at goal  Blood pressure unable to assess  Stop smoking smoking the same amount    Self Care Goals & Plans:  Self Care Goal 10/12/2015  Manage my medications take my medicines as prescribed  Stop smoking cut down the number of cigarettes smoked    Home Blood Glucose Monitoring 10/12/2015  Check my blood sugar 2 times a day  When to check my blood sugar before breakfast; before dinner     Care Management & Community Referrals:  Referral 10/12/2015  Referrals made for care management support none needed

## 2015-11-09 ENCOUNTER — Ambulatory Visit: Payer: Medicare Other | Admitting: Internal Medicine

## 2015-11-11 NOTE — Progress Notes (Signed)
Internal Medicine Clinic Attending  Case discussed with Dr. Hoffman soon after the resident saw the patient.  We reviewed the resident's history and exam and pertinent patient test results.  I agree with the assessment, diagnosis, and plan of care documented in the resident's note. 

## 2015-12-02 ENCOUNTER — Ambulatory Visit (INDEPENDENT_AMBULATORY_CARE_PROVIDER_SITE_OTHER): Payer: Medicare Other | Admitting: Internal Medicine

## 2015-12-02 ENCOUNTER — Encounter: Payer: Self-pay | Admitting: Internal Medicine

## 2015-12-02 VITALS — BP 167/91 | HR 91 | Temp 97.6°F | Ht 67.0 in | Wt 209.1 lb

## 2015-12-02 DIAGNOSIS — F17210 Nicotine dependence, cigarettes, uncomplicated: Secondary | ICD-10-CM

## 2015-12-02 DIAGNOSIS — I10 Essential (primary) hypertension: Secondary | ICD-10-CM | POA: Diagnosis not present

## 2015-12-02 DIAGNOSIS — Z794 Long term (current) use of insulin: Secondary | ICD-10-CM

## 2015-12-02 DIAGNOSIS — Z79899 Other long term (current) drug therapy: Secondary | ICD-10-CM

## 2015-12-02 DIAGNOSIS — E11649 Type 2 diabetes mellitus with hypoglycemia without coma: Secondary | ICD-10-CM

## 2015-12-02 LAB — GLUCOSE, CAPILLARY: GLUCOSE-CAPILLARY: 102 mg/dL — AB (ref 65–99)

## 2015-12-02 NOTE — Patient Instructions (Signed)
I want you to reduce your insulin further to only 20units at night.  Keep checking your blood sugars.  General Instructions:   Please bring your medicines with you each time you come to clinic.  Medicines may include prescription medications, over-the-counter medications, herbal remedies, eye drops, vitamins, or other pills.   Progress Toward Treatment Goals:  Treatment Goal 10/12/2015  Hemoglobin A1C at goal  Blood pressure unable to assess  Stop smoking smoking the same amount    Self Care Goals & Plans:  Self Care Goal 12/02/2015  Manage my medications take my medicines as prescribed; bring my medications to every visit; refill my medications on time  Stop smoking cut down the number of cigarettes smoked    Home Blood Glucose Monitoring 10/12/2015  Check my blood sugar 2 times a day  When to check my blood sugar before breakfast; before dinner     Care Management & Community Referrals:  Referral 10/12/2015  Referrals made for care management support none needed

## 2015-12-03 ENCOUNTER — Telehealth: Payer: Self-pay | Admitting: Dietician

## 2015-12-03 NOTE — Telephone Encounter (Signed)
Patient called to schedule an appointment. She says she does not have an immediate need for diabetes or nutrition training as her blood sugars are better now on the 30 units lantus daily. (no more hypoglycemia) 104 this morning,  100s yesterday. She wants helps weight meal planning and weight loss,.  Her call was transferred to the front desk . She called back and said she could not get an appointment with the doctor and because of transportation has to come on the same day, so she would not be seeing me for a few months. Encouraged her to call anytime between visits for questions fo concerns for her diabetes.

## 2015-12-06 NOTE — Progress Notes (Signed)
Internal Medicine Clinic Attending  Case discussed with Dr. Hoffman at the time of the visit.  We reviewed the resident's history and exam and pertinent patient test results.  I agree with the assessment, diagnosis, and plan of care documented in the resident's note.  

## 2015-12-06 NOTE — Assessment & Plan Note (Signed)
HPI: She reports she has been taking 30 units of Lantus QHS.  She does report some hypoglycemic episodes in the AM. She brings in a hand written log of twice daily CBG results which shows some hypoglycemic sugars in the morning with the lowest in the high 50s, the night time readings are typically in the 140-160 range.  She does have her meter which cannot be downloaded. Oddly when I reviewed the meter manually the date is correct and the only readings on there are from january.  A: Controlled T2 DM with Hypoglycemia  P: -Continue Metformin 1g BID -Reduce Lantus to 20 units, I want he to have additional teaching about controlling her disease and will refer her for Diabetic education

## 2015-12-06 NOTE — Progress Notes (Signed)
[facility] Subjective:   Patient ID: [name] female   DOB: 12-20-1950 66 y.o.   MRN: [id number]  HPI: [name] is a 65 y.o. female with a PMH detailed below who presents for 1 month follow up for DM.  Please see problem based charting below for the status of her chronic medical problems.    Past Medical History  Diagnosis Date  . Asthma   . Hypertension   . Arthritis   . Diabetes mellitus     type II  . Allergy   . Depression   . Hyperlipidemia   . Seizures ([place])    Current Outpatient Prescriptions  Medication Sig Dispense Refill  . amLODipine (NORVASC) 10 MG tablet Take 10 mg by mouth daily.    . clobetasol ointment (TEMOVATE) AB-123456789 % Apply 1 application topically 2 (two) times daily.   3  . cyclobenzaprine (FLEXERIL) 5 MG tablet Take 1 tablet (5 mg total) by mouth every 8 (eight) hours as needed for muscle spasms. 30 tablet 1  . fluPHENAZine (PROLIXIN) 2.5 MG tablet Take 2.5 mg by mouth daily.    Marland Kitchen gabapentin (NEURONTIN) 800 MG tablet TAKE 1 TABLET BY MOUTH THREE TIMES DAILY 90 tablet 7  . insulin glargine (LANTUS) 100 UNIT/ML injection Inject 20 Units into the skin at bedtime.    Marland Kitchen lisinopril-hydrochlorothiazide (PRINZIDE,ZESTORETIC) 20-25 MG per tablet Take 1 tablet by mouth daily.    . metFORMIN (GLUCOPHAGE) 1000 MG tablet Take 1,000 mg by mouth 2 (two) times daily with a meal.     No current facility-administered medications for this visit.   Family History  Problem Relation Age of Onset  . Heart disease Mother   . Diabetes Mother   . Depression Mother   . Cancer Father   . Diabetes Father   . Depression Father   . Diabetes Sister   . Kidney disease Brother   . Diabetes Brother   . Kidney disease Brother   . Diabetes Brother   . Diabetes Brother   . Diabetes Brother    Social History   Social History  . Marital Status: Single    Spouse Name: N/A  . Number of Children: N/A  . Years of Education: N/A   Social  History Main Topics  . Smoking status: Current Every Day Smoker -- 1.00 packs/day    Types: Cigarettes  . Smokeless tobacco: Never Used     Comment: 3-5 ciggarettes a day  . Alcohol Use: 0.0 oz/week    0 Standard drinks or equivalent per week     Comment: occassional glass of wine  . Drug Use: No  . Sexual Activity: Not Currently   Other Topics Concern  . None   Social History Narrative   Review of Systems: Review of Systems  Constitutional: Negative for fever and chills.  Eyes: Negative for blurred vision.  Respiratory: Negative for cough.   Cardiovascular: Negative for chest pain.  Musculoskeletal: Negative for myalgias.  Endo/Heme/Allergies: Negative for polydipsia.     Objective:  Physical Exam: Filed Vitals:   12/02/15 1422  BP: 167/91  Pulse: 91  Temp: 97.6 F (36.4 C)  TempSrc: Oral  Height: 5\' 7"  (1.702 m)  Weight: 209 lb 1.6 oz (94.847 kg)  SpO2: 97%  Physical Exam  Constitutional: She is well-developed, well-nourished, and in no distress.  Cardiovascular: Normal rate, regular rhythm and normal heart sounds.   Pulmonary/Chest: Effort normal and breath sounds normal.  Musculoskeletal: She exhibits no edema.  Nursing note and vitals reviewed.   Assessment & Plan:  Case discussed with Dr. Daryll Drown  DM type 2 (diabetes mellitus, type 2) HPI: She reports she has been taking 30 units of Lantus QHS.  She does report some hypoglycemic episodes in the AM. She brings in a hand written log of twice daily CBG results which shows some hypoglycemic sugars in the morning with the lowest in the high 50s, the night time readings are typically in the 140-160 range.  She does have her meter which cannot be downloaded. Oddly when I reviewed the meter manually the date is correct and the only readings on there are from january.  A: Controlled T2 DM with Hypoglycemia  P: -Continue Metformin 1g BID -Reduce Lantus to 20 units, I want he to have additional teaching about  controlling her disease and will refer her for Diabetic education  Hypertension HPI: Did not take BP meds this morning  A: Essential HTN not at goal  P: Continue current meds, close follow up in 1 month.    Medications Ordered No orders of the defined types were placed in this encounter.   Other Orders Orders Placed This Encounter  Procedures  . Glucose, capillary  . Ambulatory referral to diabetic education    Referral Priority:  Routine    Referral Type:  Consultation    Referral Reason:  Specialty Services Required    Referred to Provider:  Chauncey Reading Plyler, RD    Number of Visits Requested:  1   Follow Up: Return in about 1 month (around 01/02/2016).

## 2015-12-06 NOTE — Telephone Encounter (Signed)
Thank you [name]. I really appreciate your help.

## 2015-12-06 NOTE — Assessment & Plan Note (Signed)
HPI: Did not take BP meds this morning  A: Essential HTN not at goal  P: Continue current meds, close follow up in 1 month.

## 2015-12-28 ENCOUNTER — Ambulatory Visit: Payer: Medicare Other | Admitting: Dietician

## 2016-02-03 ENCOUNTER — Encounter: Payer: Medicare Other | Admitting: Internal Medicine

## 2016-02-04 ENCOUNTER — Telehealth: Payer: Self-pay | Admitting: Internal Medicine

## 2016-02-04 NOTE — Telephone Encounter (Signed)
APT. REMINDER CALL, LMTCB °

## 2016-02-08 ENCOUNTER — Other Ambulatory Visit: Payer: Self-pay | Admitting: Student in an Organized Health Care Education/Training Program

## 2016-02-08 ENCOUNTER — Encounter: Payer: Self-pay | Admitting: Internal Medicine

## 2016-02-08 ENCOUNTER — Other Ambulatory Visit: Payer: Self-pay | Admitting: Dietician

## 2016-02-08 ENCOUNTER — Ambulatory Visit (INDEPENDENT_AMBULATORY_CARE_PROVIDER_SITE_OTHER): Payer: Medicare Other | Admitting: Dietician

## 2016-02-08 ENCOUNTER — Ambulatory Visit (INDEPENDENT_AMBULATORY_CARE_PROVIDER_SITE_OTHER): Payer: Medicare Other | Admitting: Internal Medicine

## 2016-02-08 ENCOUNTER — Encounter: Payer: Self-pay | Admitting: Dietician

## 2016-02-08 VITALS — BP 133/70 | HR 74 | Temp 98.2°F | Ht 67.0 in | Wt 196.8 lb

## 2016-02-08 DIAGNOSIS — E11649 Type 2 diabetes mellitus with hypoglycemia without coma: Secondary | ICD-10-CM

## 2016-02-08 DIAGNOSIS — Z794 Long term (current) use of insulin: Secondary | ICD-10-CM | POA: Diagnosis not present

## 2016-02-08 DIAGNOSIS — Z6830 Body mass index (BMI) 30.0-30.9, adult: Secondary | ICD-10-CM | POA: Diagnosis not present

## 2016-02-08 DIAGNOSIS — Z713 Dietary counseling and surveillance: Secondary | ICD-10-CM | POA: Diagnosis not present

## 2016-02-08 DIAGNOSIS — E119 Type 2 diabetes mellitus without complications: Secondary | ICD-10-CM

## 2016-02-08 DIAGNOSIS — F17210 Nicotine dependence, cigarettes, uncomplicated: Secondary | ICD-10-CM

## 2016-02-08 DIAGNOSIS — Z79899 Other long term (current) drug therapy: Secondary | ICD-10-CM

## 2016-02-08 DIAGNOSIS — K635 Polyp of colon: Secondary | ICD-10-CM

## 2016-02-08 DIAGNOSIS — I10 Essential (primary) hypertension: Secondary | ICD-10-CM | POA: Diagnosis not present

## 2016-02-08 DIAGNOSIS — Z7984 Long term (current) use of oral hypoglycemic drugs: Secondary | ICD-10-CM | POA: Diagnosis not present

## 2016-02-08 DIAGNOSIS — Z1211 Encounter for screening for malignant neoplasm of colon: Secondary | ICD-10-CM

## 2016-02-08 LAB — GLUCOSE, CAPILLARY: GLUCOSE-CAPILLARY: 176 mg/dL — AB (ref 65–99)

## 2016-02-08 LAB — POCT GLYCOSYLATED HEMOGLOBIN (HGB A1C): Hemoglobin A1C: 6.7

## 2016-02-08 MED ORDER — INSULIN GLARGINE 100 UNIT/ML ~~LOC~~ SOLN: 30.0000 [IU] | Freq: Every day | SUBCUTANEOUS | Status: DC

## 2016-02-08 NOTE — Progress Notes (Signed)
  Medical Nutrition Therapy:  Appt start time: [id number] end time:  1600. Visit # 1  Assessment:  Primary concerns today: weight loss.  [name] wants help with weight loss, however she seems unsure about her desire to make the needed changes to do this. She has a problem with transportation and cannot attend [employer] or visits here. She cooks and shops for herself, eats fruits, vegetables and dairy. She eats fired foods a few days a week. She sometimes takes supplements to help with weight loss. She does some exercise around her house, but doesn't think it is 30 minutes/day. From her food record and food frequency record she drinks considerable amounts >4 servings per day of milk and yogurt.  Preferred Learning Style: No preference indicated  Learning Readiness: Contemplating  ANTHROPOMETRICS: weight- 197#, height-67", BMI-30-31 WEIGHT HISTORY:has tried to lose weight on her own in past, wants to be 135-150# SLEEP:8-12 hours a day MEDICATIONS: lantus- she does not want to use insulin pens because of her fingers/hands stiffness, metformin BLOOD SUGAR: A1C excellent at 6,7% DIETARY INTAKE: Usual eating pattern includes 2-3 meals and 1-3 snacks per day. Everyday foods include fried foods, milk  24-hr recall:  B ( 9 AM): skips or has coffee, yogurt, water, iced tea  Snk ( AM): grilled chicken  L ( 12 PM): steak, potato, peas and water, milk Snk ( 3 PM): greens D ( 6 PM): rice, broccoli, carrots, sauce, shrimp, coke Snk ( PM): macaroni Beverages: milk, water, coffee, juice, iced tea   Daily Estimated energy needs: 1200-1600 calories for gradual weight loss of ~ 1-2 pound per week  Progress Towards Goal(s):  In progress.   Nutritional Diagnosis:  NB-1.1 Food and nutrition-related knowledge deficit As related to lack of prior sufficient meal planning training.  As evidenced by her report, questions and lack of knowledge.    Intervention:  Nutrition education about Medicare benefits,  new One touch meter, healthy eight loss and food choices as well as importance fo consistent carb Coordination of care: asked for meter supplies ans syringe prescriptions Teaching Method Utilized: Visual,,Auditory,Hands on Handouts given during visit include:  Barriers to learning/adherence to lifestyle change: lack of transportation Demonstrated degree of understanding via:  Teach Back   Monitoring/Evaluation:  Dietary intake, exercise, meter, and body weight in 3 month(s)- only because she says she can only come when she has doctor appointments.

## 2016-02-08 NOTE — Patient Instructions (Addendum)
Continue taking your blood pressure medications as before.  Diabetes: Continue taking Metformin as instructed. Use Lantus 30-35 units at bedtime as instructed. Check your blood sugar at least once daily.  I have given you a referral for a screening colonoscopy.  You have also been referred to opthalmology for an eye exam.    Return to the clinic in 3 months.

## 2016-02-08 NOTE — Telephone Encounter (Signed)
Called patient's pharmacies to find out which one is her preferred retail pharmacy for her diabetes testing supplies to be 0$: She uses [facility]: Her lantus was filled for 2$ on 11/11/15 20 mL, 10/15/15- 20 ml, 09/21/15- 20 mL Metformin- 12/27/15- 30   Her preferred meter is a Onetouch. I can give her a sample today and teach her how to use it. She will need a prescription for the strips and lancets sent to [facility] and she also says she needs syringes.

## 2016-02-09 MED ORDER — GLUCOSE BLOOD VI STRP: ORAL_STRIP | Status: DC

## 2016-02-09 MED ORDER — "INSULIN SYRINGE-NEEDLE U-100 31G X 15/64"" 0.3 ML MISC": Status: DC

## 2016-02-09 MED ORDER — ONETOUCH DELICA LANCETS FINE MISC: Status: DC

## 2016-02-09 NOTE — Progress Notes (Signed)
Patient ID: [name], female   DOB: 1951-05-23, 65 y.o.   MRN: [id number]   Subjective:   Patient ID: [name] female   DOB: 01-30-1951 65 y.o.   MRN: [id number]  HPI: [name] is a 65 y.o. F with a PMHx of conditions listed below presenting to the clinic for a follow-up of her diabetes and HTN. Please see problem based charting for the status of the patient's chronic medical conditions.     Past Medical History  Diagnosis Date  . Asthma   . Hypertension   . Arthritis   . Diabetes mellitus     type II  . Allergy   . Depression   . Hyperlipidemia   . Seizures ([place])    Current Outpatient Prescriptions  Medication Sig Dispense Refill  . amLODipine (NORVASC) 10 MG tablet Take 10 mg by mouth daily.    . clobetasol ointment (TEMOVATE) [id number] % Apply 1 application topically 2 (two) times daily.   3  . cyclobenzaprine (FLEXERIL) 5 MG tablet Take 1 tablet (5 mg total) by mouth every 8 (eight) hours as needed for muscle spasms. 30 tablet 1  . fluPHENAZine (PROLIXIN) 2.5 MG tablet Take 2.5 mg by mouth daily.    Marland Kitchen gabapentin (NEURONTIN) 800 MG tablet TAKE 1 TABLET BY MOUTH THREE TIMES DAILY 90 tablet 7  . glucose blood (ONETOUCH VERIO) test strip Use to check your blood sugar two times a day 100 each 11  . insulin glargine (LANTUS) 100 UNIT/ML injection Inject 0.3-0.35 mLs (30-35 Units total) into the skin at bedtime. 10 mL 5  . Insulin Syringe-Needle U-100 31G X 15/64" 0.3 ML MISC Use to inject lantus insulin one time a day 100 each 11  . lisinopril-hydrochlorothiazide (PRINZIDE,ZESTORETIC) 20-25 MG per tablet Take 1 tablet by mouth daily.    . metFORMIN (GLUCOPHAGE) 1000 MG tablet Take 1,000 mg by mouth 2 (two) times daily with a meal.    . metoprolol succinate (TOPROL-XL) 25 MG 24 hr tablet 1 tablet daily.  5  . omeprazole (PRILOSEC) 40 MG capsule 1 capsule daily.  5  . ONETOUCH DELICA LANCETS FINE MISC Use to check your blood sugar two times a day 100 each 11   No current  facility-administered medications for this visit.   Family History  Problem Relation Age of Onset  . Heart disease Mother   . Diabetes Mother   . Depression Mother   . Cancer Father   . Diabetes Father   . Depression Father   . Diabetes Sister   . Kidney disease Brother   . Diabetes Brother   . Kidney disease Brother   . Diabetes Brother   . Diabetes Brother   . Diabetes Brother    Social History   Social History  . Marital Status: Single    Spouse Name: N/A  . Number of Children: N/A  . Years of Education: N/A   Social History Main Topics  . Smoking status: Current Every Day Smoker -- 1.00 packs/day    Types: Cigarettes  . Smokeless tobacco: Never Used     Comment: 3-5 ciggarettes a day  . Alcohol Use: 0.0 oz/week    0 Standard drinks or equivalent per week     Comment: occassional glass of wine  . Drug Use: No  . Sexual Activity: Not Currently   Other Topics Concern  . None   Social History Narrative   Review of Systems: Review of Systems  Constitutional: Negative for fever and chills.  HENT: Negative for congestion.   Eyes: Negative for blurred vision and pain.  Respiratory: Negative for cough, shortness of breath and wheezing.   Cardiovascular: Negative for chest pain, palpitations and leg swelling.  Gastrointestinal: Negative for nausea, vomiting, abdominal pain and diarrhea.  Genitourinary: Negative for dysuria and flank pain.  Musculoskeletal: Negative for myalgias and joint pain.  Skin: Negative for itching.  Neurological: Negative for sensory change, focal weakness and headaches.   Objective:  Physical Exam: Filed Vitals:   02/08/16 1503  BP: 133/70  Pulse: 74  Temp: 98.2 F (36.8 C)  TempSrc: Oral  Height: 5\' 7"  (1.702 m)  Weight: 196 lb 12.8 oz (89.268 kg)  SpO2: 100%   Physical Exam  Constitutional: She is oriented to person, place, and time. She appears well-developed and well-nourished. No distress.  HENT:  Head: Normocephalic and  atraumatic.  Mouth/Throat: Oropharynx is clear and moist.  Eyes: EOM are normal. Pupils are equal, round, and reactive to light.  Neck: Neck supple. No tracheal deviation present.  Cardiovascular: Normal rate, regular rhythm and intact distal pulses.   Pulmonary/Chest: Effort normal and breath sounds normal. No respiratory distress. She has no wheezes. She has no rales.  Abdominal: Soft. Bowel sounds are normal. She exhibits no distension. There is no tenderness. There is no guarding.  Musculoskeletal: Normal range of motion. She exhibits no tenderness.  Neurological: She is alert and oriented to person, place, and time.  Skin: Skin is warm and dry. No erythema.   Assessment & Plan:

## 2016-02-10 NOTE — Assessment & Plan Note (Signed)
BP Readings from Last 3 Encounters:  02/08/16 133/70  12/02/15 167/91  11/08/15 139/75    Lab Results  Component Value Date   NA 138 04/04/2014   K 3.4* 04/04/2014   CREATININE 0.99 12/18/2014    Assessment: Blood pressure control:  good  Progress toward BP goal:   below goal (<140/90) Comments: She is currently taking Amlodipine 10 mg qd, Lisinopril-HCTZ 20-25 mg daily, and Metoprolol 25 mg qd.   Plan: Medications:  continue current medications Educational resources provided:  Educated about diet and exercise.  Other plans: Asked patient to check her BP daily and keep a log of the recordings.

## 2016-02-10 NOTE — Assessment & Plan Note (Signed)
A:  Patient is due for a screening colonoscopy.   P: Screening colonoscopy ordered.

## 2016-02-10 NOTE — Assessment & Plan Note (Addendum)
Lab Results  Component Value Date   HGBA1C 6.7 02/08/2016   HGBA1C 6.9 10/12/2015     Assessment: Diabetes control:  controlled  Progress toward A1C goal:   below goal (<7) Comments: During her previous visit, patient was advised to use Lantus 20 U. Patient reports using 30-35 U Lantus QHS instead and denies having any hypoglycemic symptoms. Meter shows patient has not been checking her blood glucose regularly - one reading for the month of May, none for April, and 4 readings for the month of March. Highest CBG 145 and lowest 73.   Plan: Medications: Continue Metformin 1g BID. Increase Lantus to 30-35 U QHS since patient has been tolerating this dose well.  Home glucose monitoring: Frequency:  at least once a day  Timing:  before meals  Instruction/counseling given: reminded to get eye exam, reminded to bring blood glucose meter & log to each visit, reminded to bring medications to each visit, discussed foot care, discussed the need for weight loss and discussed diet Other plans:  -Patient had a meeting with our diabetes educator during this visit -RTC in 3 months with meter and log. Check A1c at that visit.  -If patient's A1c remains 6.7 or lower during future visit, consider discontinuing insulin and starting patient on a second oral hypoglycemic agent such as a sulfonylurea or SGLT2 inhibitor.

## 2016-02-11 NOTE — Progress Notes (Signed)
Internal Medicine Clinic Attending  Case discussed with Dr. Rathoreat the time of the visit. We reviewed the resident's history and exam and pertinent patient test results. I agree with the assessment, diagnosis, and plan of care documented in the resident's note.  

## 2016-02-15 ENCOUNTER — Ambulatory Visit: Payer: Medicare Other | Admitting: Podiatry

## 2016-02-18 ENCOUNTER — Encounter: Payer: Self-pay | Admitting: Gastroenterology

## 2016-02-25 ENCOUNTER — Encounter: Payer: Self-pay | Admitting: Internal Medicine

## 2016-03-13 ENCOUNTER — Telehealth: Payer: Self-pay | Admitting: Dietician

## 2016-03-13 NOTE — Telephone Encounter (Signed)
Called patient to follow up on her efforts at weight loss she is eating smaller portions and more fruits and vegetables, and replacing bread with wraps. We discussed that the wraps could have the same amount of calories as her bread. She has no way to weigh at home. She got her meter and supplies without any problem. Encouraged her to call anytime.   Her last eye exam was ~ 07/2015 at [facility].

## 2016-04-05 ENCOUNTER — Encounter: Payer: Self-pay | Admitting: Gastroenterology

## 2016-04-05 ENCOUNTER — Ambulatory Visit (AMBULATORY_SURGERY_CENTER): Payer: Self-pay

## 2016-04-05 VITALS — Ht 66.5 in | Wt 194.6 lb

## 2016-04-05 DIAGNOSIS — Z800 Family history of malignant neoplasm of digestive organs: Secondary | ICD-10-CM

## 2016-04-05 DIAGNOSIS — Z1211 Encounter for screening for malignant neoplasm of colon: Secondary | ICD-10-CM

## 2016-04-05 NOTE — Progress Notes (Signed)
No allergies to eggs or soy No home oxygen No diet meds No past problems with anesthesia  No internet 

## 2016-04-07 ENCOUNTER — Other Ambulatory Visit: Payer: Self-pay | Admitting: *Deleted

## 2016-04-07 MED ORDER — CYCLOBENZAPRINE HCL 5 MG PO TABS: 5.0000 mg | ORAL_TABLET | Freq: Two times a day (BID) | ORAL | 0 refills | Status: DC | PRN

## 2016-04-07 NOTE — Telephone Encounter (Signed)
Has appt 05/16/2016 with pcp.[name], Brooke Burnside Cassady7/28/201710:28 AM

## 2016-04-19 ENCOUNTER — Encounter: Payer: Self-pay | Admitting: Gastroenterology

## 2016-04-19 ENCOUNTER — Ambulatory Visit (AMBULATORY_SURGERY_CENTER): Payer: Medicare Other | Admitting: Gastroenterology

## 2016-04-19 VITALS — BP 134/78 | HR 81 | Temp 96.8°F | Resp 22 | Ht 66.0 in | Wt 194.0 lb

## 2016-04-19 DIAGNOSIS — D128 Benign neoplasm of rectum: Secondary | ICD-10-CM

## 2016-04-19 DIAGNOSIS — Z800 Family history of malignant neoplasm of digestive organs: Secondary | ICD-10-CM

## 2016-04-19 DIAGNOSIS — K621 Rectal polyp: Secondary | ICD-10-CM

## 2016-04-19 DIAGNOSIS — Z1211 Encounter for screening for malignant neoplasm of colon: Secondary | ICD-10-CM | POA: Diagnosis not present

## 2016-04-19 DIAGNOSIS — Z538 Procedure and treatment not carried out for other reasons: Secondary | ICD-10-CM

## 2016-04-19 LAB — GLUCOSE, CAPILLARY
GLUCOSE-CAPILLARY: 83 mg/dL (ref 65–99)
Glucose-Capillary: 128 mg/dL — ABNORMAL HIGH (ref 65–99)

## 2016-04-19 MED ORDER — SODIUM CHLORIDE 0.9 % IV SOLN: 500.0000 mL | INTRAVENOUS | Status: DC

## 2016-04-19 NOTE — Progress Notes (Signed)
Called to room to assist during endoscopic procedure.  Patient ID and intended procedure confirmed with present staff. Received instructions for my participation in the procedure from the performing physician.  

## 2016-04-19 NOTE — Op Note (Signed)
[facility] Patient Name: [name] Procedure Date: 04/19/2016 10:19 AM MRN: [id number] Endoscopist: Mallie Mussel L. Loletha Carrow , MD Age: 65 Referring MD:  Date of Birth: [birth year] Gender: Female Account #: [id number] Procedure:                Colonoscopy Indications:              Screening in patient at increased risk: Family                            history of 1st-degree relative with colorectal                            cancer (father), This is the patient's first                            colonoscopy Medicines:                Monitored Anesthesia Care Procedure:                Pre-Anesthesia Assessment:                           - Prior to the procedure, a History and Physical                            was performed, and patient medications and                            allergies were reviewed. The patient's tolerance of                            previous anesthesia was also reviewed. The risks                            and benefits of the procedure and the sedation                            options and risks were discussed with the patient.                            All questions were answered, and informed consent                            was obtained. Prior Anticoagulants: The patient has                            taken no previous anticoagulant or antiplatelet                            agents. ASA Grade Assessment: III - A patient with                            severe systemic disease. After reviewing the risks  and benefits, the patient was deemed in                            satisfactory condition to undergo the procedure.                           After obtaining informed consent, the colonoscope                            was passed under direct vision. Throughout the                            procedure, the patient's blood pressure, pulse, and                            oxygen saturations were monitored continuously. The                        Model CF-HQ190L [id number]) scope was introduced                            through the anus and advanced to the the cecum,                            identified by appendiceal orifice and ileocecal                            valve. The colonoscopy was performed without                            difficulty. The patient tolerated the procedure                            well. The quality of the bowel preparation was poor                            (the patient was noncompliant with pre-procedure                            dietary instructions). The ileocecal valve,                            appendiceal orifice, and rectum were photographed.                            The quality of the bowel preparation was evaluated                            using the BBPS [facility] Bowel Preparation Scale)                            with scores of: Right Colon = 1, Transverse Colon =  1 and Left Colon = 1. The total BBPS score equals                            3. The bowel preparation used was Miralax. Scope In: 10:26:38 AM Scope Out: 10:43:18 AM Scope Withdrawal Time: 0 hours 10 minutes 43 seconds  Total Procedure Duration: 0 hours 16 minutes 40 seconds  Findings:                 The perianal and digital rectal examinations were                            normal.                           Diverticula were found in the entire colon.                           Two sessile polyps were found in the rectum. The                            polyps were 2-4 mm in size. These polyps were                            removed with a cold snare. Resection and retrieval                            were complete.                           The exam was otherwise without abnormality on                            direct and retroflexion views. Complications:            No immediate complications. Estimated Blood Loss:     Estimated blood loss: none. Impression:                - Diverticulosis in the entire examined colon.                           - Two 2-4 mm polyps in the rectum, removed with a                            cold snare. Resected and retrieved.                           - The examination was otherwise normal on direct                            and retroflexion views. Recommendation:           - Patient has a contact number available for                            emergencies. The signs and symptoms of potential  delayed complications were discussed with the                            patient. Return to normal activities tomorrow.                            Written discharge instructions were provided to the                            patient.                           - Resume previous diet.                           - Continue present medications.                           - Await pathology results.                           - Repeat colonoscopy in 1 year for surveillance due                            to poor preparation. Berma Harts L. Loletha Carrow, MD 04/19/2016 10:49:19 AM This report has been signed electronically.

## 2016-04-19 NOTE — Progress Notes (Signed)
Patient awakening,vss,report to rn 

## 2016-04-19 NOTE — Patient Instructions (Signed)

## 2016-04-19 NOTE — Progress Notes (Signed)
Pt drank approximately 17 oz at 8:00 am.  She also last yesterday at 3:00 pm [employer].  I reposted this to Barkley Boards, CRNA, Dr. Wilfrid Lund and Waldo Laine, RN.  We will post pone procedure until 10:00. Pt and her care partner was made aware of this. maw

## 2016-04-20 ENCOUNTER — Telehealth: Payer: Self-pay

## 2016-04-20 NOTE — Telephone Encounter (Signed)
  Follow up Call-  Call back number 04/19/2016  Post procedure Call Back phone  # [phone] hm  Permission to leave phone message Yes  Some recent data might be hidden    Patient states that she was so happy with the care that she received in the endoscopy center that she did not want to leave. She said that she kept trying to come up with excuses to stay longer after her procedure. She said everyone was so wonderful and treated her like she was special.   Patient questions:  Do you have a fever, pain , or abdominal swelling? No. Pain Score  0 *  Have you tolerated food without any problems? Yes.    Have you been able to return to your normal activities? Yes.    Do you have any questions about your discharge instructions: Diet   No. Medications  No. Follow up visit  No.  Do you have questions or concerns about your Care? No.  Actions: * If pain score is 4 or above: No action needed, pain <4.

## 2016-04-27 ENCOUNTER — Encounter: Payer: Self-pay | Admitting: Gastroenterology

## 2016-05-05 DIAGNOSIS — K635 Polyp of colon: Secondary | ICD-10-CM | POA: Insufficient documentation

## 2016-05-08 ENCOUNTER — Telehealth: Payer: Self-pay | Admitting: Dietician

## 2016-05-08 NOTE — Telephone Encounter (Signed)
Patient called with a message for her doctor: [facility] told her that they have no record of her vaccinations/immunizaions. She would like this addressed on her appointment on  05/16/2016. I told her that I would let her doctor know and we should be able to address this at her appointment.

## 2016-05-10 NOTE — Telephone Encounter (Signed)
Thanks Donna!

## 2016-05-16 ENCOUNTER — Other Ambulatory Visit: Payer: Self-pay | Admitting: Internal Medicine

## 2016-05-16 ENCOUNTER — Ambulatory Visit (INDEPENDENT_AMBULATORY_CARE_PROVIDER_SITE_OTHER): Payer: Medicare Other | Admitting: Dietician

## 2016-05-16 ENCOUNTER — Ambulatory Visit (INDEPENDENT_AMBULATORY_CARE_PROVIDER_SITE_OTHER): Payer: Medicare Other | Admitting: Internal Medicine

## 2016-05-16 ENCOUNTER — Encounter: Payer: Self-pay | Admitting: Internal Medicine

## 2016-05-16 VITALS — BP 123/69 | HR 85 | Temp 98.0°F | Ht 67.0 in | Wt 200.8 lb

## 2016-05-16 DIAGNOSIS — E1142 Type 2 diabetes mellitus with diabetic polyneuropathy: Secondary | ICD-10-CM

## 2016-05-16 DIAGNOSIS — R21 Rash and other nonspecific skin eruption: Secondary | ICD-10-CM

## 2016-05-16 DIAGNOSIS — E119 Type 2 diabetes mellitus without complications: Secondary | ICD-10-CM | POA: Diagnosis not present

## 2016-05-16 DIAGNOSIS — Z79899 Other long term (current) drug therapy: Secondary | ICD-10-CM

## 2016-05-16 DIAGNOSIS — M545 Low back pain: Secondary | ICD-10-CM | POA: Diagnosis not present

## 2016-05-16 DIAGNOSIS — Z794 Long term (current) use of insulin: Secondary | ICD-10-CM

## 2016-05-16 DIAGNOSIS — Z713 Dietary counseling and surveillance: Secondary | ICD-10-CM

## 2016-05-16 DIAGNOSIS — I10 Essential (primary) hypertension: Secondary | ICD-10-CM | POA: Diagnosis not present

## 2016-05-16 DIAGNOSIS — E11649 Type 2 diabetes mellitus with hypoglycemia without coma: Secondary | ICD-10-CM

## 2016-05-16 DIAGNOSIS — Z13820 Encounter for screening for osteoporosis: Secondary | ICD-10-CM

## 2016-05-16 DIAGNOSIS — F419 Anxiety disorder, unspecified: Secondary | ICD-10-CM

## 2016-05-16 DIAGNOSIS — Z23 Encounter for immunization: Secondary | ICD-10-CM | POA: Diagnosis not present

## 2016-05-16 DIAGNOSIS — F17210 Nicotine dependence, cigarettes, uncomplicated: Secondary | ICD-10-CM

## 2016-05-16 DIAGNOSIS — Z720 Tobacco use: Secondary | ICD-10-CM

## 2016-05-16 DIAGNOSIS — M546 Pain in thoracic spine: Secondary | ICD-10-CM

## 2016-05-16 DIAGNOSIS — K219 Gastro-esophageal reflux disease without esophagitis: Secondary | ICD-10-CM

## 2016-05-16 LAB — POCT GLYCOSYLATED HEMOGLOBIN (HGB A1C): Hemoglobin A1C: 6.7

## 2016-05-16 LAB — GLUCOSE, CAPILLARY: GLUCOSE-CAPILLARY: 214 mg/dL — AB (ref 65–99)

## 2016-05-16 MED ORDER — CYCLOBENZAPRINE HCL 5 MG PO TABS: 5.0000 mg | ORAL_TABLET | Freq: Every evening | ORAL | 0 refills | Status: DC | PRN

## 2016-05-16 MED ORDER — INSULIN GLARGINE 100 UNIT/ML ~~LOC~~ SOLN: 25.0000 [IU] | Freq: Every day | SUBCUTANEOUS | 5 refills | Status: DC

## 2016-05-16 NOTE — Patient Instructions (Signed)
STOP taking metoprolol  Use Lantus 25 u units in the evening as instructed.  Check your blood sugar once daily   STOP using Clobetasol ointment   Please follow-up with dermatology and psychiatry.

## 2016-05-16 NOTE — Progress Notes (Signed)
   CC: Patient is here to discuss her chronic medical conditions and get refills on medications.   HPI:  [name] is a 65 y.o. F with a PMHx of conditions listed below presenting to the clinic to discuss her hypertension, diabetes, back muscle spasms, skin rash, anxiety, diabetic peripheral neuropathy, GERD, and tobacco use. Please see problem based charting for the status of the patient's chronic medical conditions.   Past Medical History:  Diagnosis Date  . Allergy   . Anxiety   . Arthritis   . Asthma   . Depression   . Diabetes mellitus    type II  . Hyperlipidemia   . Hypertension   . Seizures ([place])    last seizure Jan 2016    Review of Systems:  Pertinent positives mentioned in HPI. Remainder of all ROS negative.   Physical Exam:  Vitals:   05/16/16 1353  BP: 123/69  Pulse: 85  Temp: 98 F (36.7 C)  TempSrc: Oral  SpO2: 100%  Weight: 200 lb 12.8 oz (91.1 kg)  Height: 5\' 7"  (1.702 m)   Physical Exam  Constitutional: She is oriented to person, place, and time. She appears well-developed and well-nourished. No distress.  HENT:  Head: Normocephalic and atraumatic.  Mouth/Throat: Oropharynx is clear and moist.  Eyes: EOM are normal. Right eye exhibits no discharge. Left eye exhibits no discharge.  Neck: Neck supple. No tracheal deviation present.  Cardiovascular: Normal rate, regular rhythm and intact distal pulses.   Pulmonary/Chest: Effort normal and breath sounds normal. No respiratory distress. She has no wheezes. She has no rales.  Abdominal: Soft. Bowel sounds are normal. She exhibits no distension. There is no tenderness. There is no guarding.  Musculoskeletal: She exhibits no deformity.  Trace edema of b/l LEs.   Neurological: She is alert and oriented to person, place, and time.  Skin: Skin is warm and dry.  Hyperpigmented urticarial papules noted on the right shoulder and arm.     Assessment & Plan:   See Encounters Tab for problem based  charting.  Patient discussed with Dr. Dareen Piano

## 2016-05-16 NOTE — Progress Notes (Signed)
  Medical Nutrition Therapy:  Appt start time: 1500 end time:  [id number]. Visit # 2  Assessment:  Primary concerns today: weight loss.  [name] wants help with weight loss, despite her 3 # weight gain in the past 3 months. She agrees to purchase a scale to self monitor. She reports an intake high in processed foods today and agrees to try to decrease her intake of these and try to eat more frozen vegetables  ANTHROPOMETRICS: weight- 200#, height-67", BMI-30-31 MEDICATIONS: lantus- decreased from 35 units to 25 units today BLOOD SUGAR: A1C stable at 6,7% DIETARY INTAKE: Usual eating pattern includes 2-3 meals and 1-3 snacks per day. Everyday foods include fried foods, milk  24-hr recall:  B ( 9 AM): skips or has coffee, yogurt, water, iced tea  Snk ( AM): poached eggs  L ( 12 PM): Ramen noodles with seasoning pack or whole container of tomato juice.  water, milk Snk ( 3 PM): greens D ( 6 PM): rice, broccoli, carrots, sauce, shrimp Beverages: milk, water, coffee, juice, iced tea, wine ( ? frequency)  and regular soda ~ 1x/month   Daily Estimated energy needs: 1200-1600 calories for gradual weight loss of ~ 1-2 pound per week  Progress Towards Goal(s):  In progress.   Nutritional Diagnosis:  NB-1.1 Food and nutrition-related knowledge deficit As related to lack of prior sufficient meal planning training.  As evidenced by her report, questions and lack of knowledge.    Intervention:  Nutrition education about healthier weight loss food choices  Coordination of care: agree with decrease in medicines Teaching Method Utilized: Visual,,Auditory,Hands on Handouts given during visit include:  Barriers to learning/adherence to lifestyle change: lack of transportation Demonstrated degree of understanding via:  Teach Back   Monitoring/Evaluation:  Dietary intake, exercise, meter, and body weight in 1 month(s)-  says she can only come when she has doctor appointments.

## 2016-05-16 NOTE — Patient Instructions (Signed)
To lower your salt intake to help your blood pressure:  Use regular pasta or macaroni instead of ramen noodles.  Can add unsalted dried beans like pintos to canned soup and frozen veggies for a quick, healthy meal.  For a quick meal when in a hurry, try half a peanut butter sandwich and a veggie or 1/2 tomato and cheese sandwich- (best cheese to buy is cheddar, mozzarella.   Good sources of protein- yogurt, cottage cheese, eggs, fish 3 times a week, chicken and [place]  To decrease sugar- eat fruit when you want something sweet- would decrease how much preserves and honey you eat, limit juice to 4 ounces a day.   Make an appointment in 1 month    Milk is great! 1% and no fat are fine

## 2016-05-17 ENCOUNTER — Telehealth: Payer: Self-pay | Admitting: Licensed Clinical Social Worker

## 2016-05-17 DIAGNOSIS — E1142 Type 2 diabetes mellitus with diabetic polyneuropathy: Secondary | ICD-10-CM | POA: Insufficient documentation

## 2016-05-17 DIAGNOSIS — Z720 Tobacco use: Secondary | ICD-10-CM | POA: Insufficient documentation

## 2016-05-17 DIAGNOSIS — R21 Rash and other nonspecific skin eruption: Secondary | ICD-10-CM | POA: Insufficient documentation

## 2016-05-17 DIAGNOSIS — K219 Gastro-esophageal reflux disease without esophagitis: Secondary | ICD-10-CM | POA: Insufficient documentation

## 2016-05-17 DIAGNOSIS — Z0000 Encounter for general adult medical examination without abnormal findings: Secondary | ICD-10-CM | POA: Insufficient documentation

## 2016-05-17 NOTE — Assessment & Plan Note (Signed)
A Per pt, symptoms are well controlled with Omeprazole 40 mg daily.  P -continue current mgmt -emphasized the importance of weight loss and smoking cessation

## 2016-05-17 NOTE — Assessment & Plan Note (Addendum)
A Per pt, symptoms are well controlled with Gabapentin 800 mg qd.  P -continue current mgmt -strict glycemic control

## 2016-05-17 NOTE — Assessment & Plan Note (Signed)
BP Readings from Last 3 Encounters:  05/16/16 123/69  04/19/16 134/78  02/08/16 133/70    Lab Results  Component Value Date   NA 138 04/04/2014   K 3.4 (L) 04/04/2014   CREATININE 0.99 12/18/2014    Assessment: Blood pressure control:  below goal (<140/90) Progress toward BP goal:   improved  Comments: Currently on amlodipine 10 mg daily, lisinopril-HCTZ 20-25 mg daily, and metoprolol ER 25 mg daily.   Plan: Medications: Since pt does not have a documented hx of CAD and BP remains below goal, will d/c Metoprolol. Patient is also not aware of any history of CAD and is not sure why she was started on metoprolol. Continue amlodipine and lisinopril-HCTZ as above.  Educational resources provided:   Educated patient about healthy eating and exercise. Emphasized the importance of weight loss.  Other plans:  -Our office will try to obtain records from pts previous PCP

## 2016-05-17 NOTE — Assessment & Plan Note (Signed)
A Pt reports using Flexeril prn occasional back muscle spasms and is requesting a refill. Currently on Flexeril 5 mg bid prn.   P -Dose has been reduced to Flexeril 5 mg qhs prn.

## 2016-05-17 NOTE — Assessment & Plan Note (Signed)
A Smoking 1 PPD x 21 yrs. Reports having a chronic cough. I discussed the importance of smoking cessation but patient seems to be in the pre-contemplative stage at this time.  P -discuss again at next visit

## 2016-05-17 NOTE — Assessment & Plan Note (Signed)
Influenza and Tdap vaccines administered at this visit.

## 2016-05-17 NOTE — Assessment & Plan Note (Signed)
A Currently on Fluphenazine 2.5 mg qd and reports it was given to her by her previous PCP for her "anxiety." States she has never been seen by a psychiatrist. States her anxiety is well controlled and denies having any visual/ auditory hallucinations.  P -Explained to the pt that Fluphenazine is not indicated for anxiety and it is best if she follows up with a psychiatrist. Referral has been placed.

## 2016-05-17 NOTE — Telephone Encounter (Signed)
Ms. Geerdes was referred to CSW for behavioral health referral.  Pt is insured through [facility].  CSW utilized Freight forwarder to obtain listing of available providers: [facility], [employer] and [facility].   CSW placed call to Ms. Rack.  Pt states she received behavioral health services from [facility] over 10 years ago, her physician left and she had difficulty with transportation to maintain services.  Pt reports she was treated for anxiety and panic attacks and had inquired some time ago with previous psychiatrist to get off medication.  It is unclear to this worker which medication pt had requested to be off, however, pt remains on at least one behavioral health med, Prolixin.  Ms. Mastin denies having any recent panic attacks, but is concerned that she may relapse due to current family medical concerns. Pt has had group counseling in the past which has helped. Pt states some time ago she was dealing with her own mental health issues and her relationship with her brother.  However, now pt voiced pride in the fact that she is living independently and has a plan to work on her weight.  Pt usses Armed forces technical officer for medical transportation.  Ms. Geddes provided CSW with daily routine schedule and hx of behavioral health services.   Pt to be referred for counseling services and med management.  CSW provided Ms. Ostlund with [employer].  Pt states she can not return to [facility] due to a prior service concern. Pt's first choice is [facility].  Pt informed of referral process and will be contacted regarding scheduling.

## 2016-05-17 NOTE — Assessment & Plan Note (Addendum)
HPI Pt reports having a skin rash from "termites" for the past 5 years. States she was followed be dermatology in the past but has not seen them for a while now. Reports using clobetasol ointment chronically and is requesting a refill. States she scratches the area all the time because it itches.   A Hyperpigmented urticarial papules noted on the right shoulder and arm. I explained to the patient that clobetasol ointment is not recommended for use past 2 weeks and chronic use could be contributing to her pruritis from thinning of epidermis & dermis and impaired healing. In addition, chronic use of corticosteroids also puts her at risk for adrenal suppression.   P -I suggested pt follow-up with dermatology again. Referral has been placed.

## 2016-05-17 NOTE — Assessment & Plan Note (Signed)
Dexa scan ordered. 

## 2016-05-17 NOTE — Telephone Encounter (Signed)
CSW placed call to [facility].  Intake Specialist, Elita Quick is not available at this time.  Confirmed referral can be faxed.  Referral faxed to [facility].

## 2016-05-17 NOTE — Assessment & Plan Note (Addendum)
Lab Results  Component Value Date   HGBA1C 6.7 05/16/2016   HGBA1C 6.7 02/08/2016   HGBA1C 6.9 10/12/2015     Assessment: Diabetes control:  below goal (<7)  Progress toward A1C goal:    stable Comments: Currently taking metformin 1000 mg bid and lantus 30 u qhs. Pt denies any hypoglycemic symptoms. Meter showing avg value 150s and lowest in 80s. Pt states she is scheduled for an eye exam in December at [facility].   Plan: Medications: Since pts A1c continues to remain below goal, will decrease dose of basal insulin to 25 u qhs. Continue metformin as above.  Home glucose monitoring: Frequency:  once a day Timing:  before meal  Instruction/counseling given: Educated patient about healthy eating and exercise. Emphasized the importance of weight loss.  Self management tools provided:   Other plans:  -repeat A1c in 3 months  -if A1c continues to be below goal at future visit, consider discontinuing insulin and instead starting pt on either a GLP-1 agonist or SGLT-2 inhibitor.

## 2016-05-18 NOTE — Progress Notes (Signed)
Internal Medicine Clinic Attending  Case discussed with Dr. Rathoreat the time of the visit. We reviewed the resident's history and exam and pertinent patient test results. I agree with the assessment, diagnosis, and plan of care documented in the resident's note.  

## 2016-06-01 ENCOUNTER — Telehealth: Payer: Self-pay | Admitting: Dietician

## 2016-06-01 NOTE — Telephone Encounter (Signed)
Wants to know if she can use liquid meal replacement/ nutritional supplement. She also asks about coupons. Answered her questions.

## 2016-06-05 ENCOUNTER — Telehealth: Payer: Self-pay | Admitting: Licensed Clinical Social Worker

## 2016-06-05 NOTE — Telephone Encounter (Signed)
CSW placed call to Ms. Capasso to follow up on behavioral health referral this worker had sent to [facility].  CSW inquired if pt has had an opportunity to establish with this agency.  Ms. Schricker states "I've decided I'm not going to do that."  Pt continues stating she has been in the mental health system all of her life and feels as though it a money making system and there was not much more they could do for her.  Ms. Nute states she will discuss with her doctor and pt made aware that services are available should she want to receive services.  CSW will mail information on local support groups and helpline information.

## 2016-06-08 ENCOUNTER — Encounter: Payer: Self-pay | Admitting: Podiatry

## 2016-06-20 ENCOUNTER — Ambulatory Visit (INDEPENDENT_AMBULATORY_CARE_PROVIDER_SITE_OTHER): Payer: Medicare Other | Admitting: Dietician

## 2016-06-20 ENCOUNTER — Ambulatory Visit (INDEPENDENT_AMBULATORY_CARE_PROVIDER_SITE_OTHER): Payer: Medicare Other | Admitting: Internal Medicine

## 2016-06-20 ENCOUNTER — Encounter: Payer: Self-pay | Admitting: Internal Medicine

## 2016-06-20 VITALS — BP 130/70 | HR 85 | Temp 98.5°F | Ht 67.0 in | Wt 201.9 lb

## 2016-06-20 DIAGNOSIS — Z713 Dietary counseling and surveillance: Secondary | ICD-10-CM

## 2016-06-20 DIAGNOSIS — Z79899 Other long term (current) drug therapy: Secondary | ICD-10-CM

## 2016-06-20 DIAGNOSIS — Z720 Tobacco use: Secondary | ICD-10-CM

## 2016-06-20 DIAGNOSIS — F17210 Nicotine dependence, cigarettes, uncomplicated: Secondary | ICD-10-CM

## 2016-06-20 DIAGNOSIS — K219 Gastro-esophageal reflux disease without esophagitis: Secondary | ICD-10-CM | POA: Diagnosis not present

## 2016-06-20 DIAGNOSIS — Z794 Long term (current) use of insulin: Secondary | ICD-10-CM

## 2016-06-20 DIAGNOSIS — R600 Localized edema: Secondary | ICD-10-CM

## 2016-06-20 DIAGNOSIS — I10 Essential (primary) hypertension: Secondary | ICD-10-CM

## 2016-06-20 DIAGNOSIS — E669 Obesity, unspecified: Secondary | ICD-10-CM | POA: Insufficient documentation

## 2016-06-20 DIAGNOSIS — E11649 Type 2 diabetes mellitus with hypoglycemia without coma: Secondary | ICD-10-CM

## 2016-06-20 DIAGNOSIS — Z6831 Body mass index (BMI) 31.0-31.9, adult: Secondary | ICD-10-CM

## 2016-06-20 DIAGNOSIS — F419 Anxiety disorder, unspecified: Secondary | ICD-10-CM

## 2016-06-20 DIAGNOSIS — E119 Type 2 diabetes mellitus without complications: Secondary | ICD-10-CM | POA: Diagnosis not present

## 2016-06-20 MED ORDER — OMEPRAZOLE 40 MG PO CPDR: 40.0000 mg | DELAYED_RELEASE_CAPSULE | Freq: Every day | ORAL | 1 refills | Status: DC

## 2016-06-20 MED ORDER — INSULIN GLARGINE 100 UNIT/ML ~~LOC~~ SOLN: 20.0000 [IU] | Freq: Every day | SUBCUTANEOUS | 3 refills | Status: DC

## 2016-06-20 MED ORDER — BUPROPION HCL ER (SR) 150 MG PO TB12: 150.0000 mg | ORAL_TABLET | Freq: Two times a day (BID) | ORAL | 1 refills | Status: DC

## 2016-06-20 MED ORDER — BUPROPION HCL ER (XL) 150 MG PO TB24: 150.0000 mg | ORAL_TABLET | ORAL | 0 refills | Status: DC

## 2016-06-20 NOTE — Progress Notes (Signed)
   CC:  Patient is here to discuss her hypertension, diabetes, GERD, tobacco use, and obesity.  HPI:  [name] is a 65 y.o.  female with a past medical history of conditions list below presenting to the clinic to discuss her hypertenson, diabetes, GERD, tobacco use, and obesity. Please see problem based charting for the status of the patient's current and chronic medical conditions.   Past Medical History:  Diagnosis Date  . Allergy   . Anxiety   . Arthritis   . Asthma   . Depression   . Diabetes mellitus    type II  . Hyperlipidemia   . Hypertension   . Seizures ([place])    last seizure Jan 2016    Review of Systems:   Review of Systems  Constitutional: Negative for chills, fever and malaise/fatigue.  Respiratory: Negative for cough, shortness of breath and wheezing.   Cardiovascular: Positive for leg swelling. Negative for chest pain.  Gastrointestinal: Negative for abdominal pain, constipation, diarrhea, heartburn, nausea and vomiting.    Physical Exam:  Vitals:   06/20/16 1439  BP: 130/70  Pulse: 85  Temp: 98.5 F (36.9 C)  TempSrc: Oral  SpO2: 100%  Weight: 201 lb 14.4 oz (91.6 kg)  Height: 5\' 7"  (1.702 m)    Physical Exam  Constitutional: She is oriented to person, place, and time. She appears well-developed and well-nourished. No distress.  HENT:  Head: Normocephalic and atraumatic.  Mouth/Throat: Oropharynx is clear and moist.  Eyes: EOM are normal. Pupils are equal, round, and reactive to light.  Neck: Neck supple. No tracheal deviation present.  Cardiovascular: Normal rate, regular rhythm and intact distal pulses.   +1 pitting edema of bilateral lower extremities  Pulmonary/Chest: Effort normal and breath sounds normal. No respiratory distress. She has no wheezes. She has no rales.  Abdominal: Soft. Bowel sounds are normal. She exhibits no distension. There is no tenderness. There is no guarding.  Musculoskeletal: Normal range of motion. She  exhibits no deformity.  Neurological: She is alert and oriented to person, place, and time.  Skin: Skin is warm and dry.    Assessment & Plan:   See Encounters Tab for problem based charting.  Patient discussed with Dr. Dareen Piano

## 2016-06-20 NOTE — Assessment & Plan Note (Addendum)
Lab Results  Component Value Date   HGBA1C 6.7 05/16/2016   HGBA1C 6.7 02/08/2016   HGBA1C 6.9 10/12/2015     Assessment: Diabetes control:   well-controlled (below goal A1c <7) Progress toward A1C goal:   stable Comments:  Patient is currently taking metformin 1000 mg twice daily and Lantus 25 units at bedtime She forgot to bring her meter to this visit. Does report feeling irritable and shaky on occasion, especially before dinner time and states her symptoms resolve after she eats. States she has an eye exam scheduled at [facility] on 07/13/2016.  Plan: Medications:   Decrease Lantus to 20 units at bedtime. Continue metformin as above. Home glucose monitoring: Frequency:  once a day Timing:  before meal  Instruction/counseling given: reminded to get eye exam, reminded to bring blood glucose meter & log to each visit, reminded to bring medications to each visit, discussed foot care, discussed the need for weight loss and discussed diet Other plans:  - A1c is due in 2 months. If A1c continues to remain below goal consider decreasing the dose of Lantus and starting patient on Liraglutide. If blood glucose remains stable, stop insulin eventually. -Advised patient to get her eye exam results faxed to our office.  Addendum 06/21/16 at 1:22 pm: Labs showing elevated urine microalbumin/ Cr ratio (415.1). Patient is currently on an ACEI. She is taking Lisinopril-HCTZ 20-25 mg 1 tablet daily.  -Increase dose of ACEI. Change to Lisinopril-HCTZ 20-12.5 mg 2 tablets daily. -Strict glycemic control

## 2016-06-20 NOTE — Patient Instructions (Addendum)
Ms. Joni Fears it was nice seeing you today.   -Continue taking your blood pressure medications as before   -For diabetes:  Inject Lantus 20 units into skin at bedtime.  Continue using Metformin as before   -To help you quit smoking cigarettes:  Take Bupropion XL 150 mg 1 tablet by mouth once a day for 3 days. Then, on day 4 start taking Bupropion SR 150 mg 1 tablet by mouth twice daily.   You may chew nicotine gum for cravings.    Return for a follow-up visit in 2 months. Please bring your meter to the visit.

## 2016-06-20 NOTE — Progress Notes (Signed)
  Medical Nutrition Therapy:  Appt start time: 1430 end time:  [id number]. Visit # 3  Assessment:  Primary concerns today: weight loss.  [name] is here for her 3rd visit and follow up. She came l;ate and did not bring her meter. She is disappointed her weight did not change. She bought a scale and has been walking and trying to make healthier choices. Recommend food records.   ANTHROPOMETRICS: weight- 201.9#, height-67", BMI-31 MEDICATIONS: lantus-  25 units  BLOOD SUGAR: A1C stable at 6,7% DIETARY INTAKE: not done in detail today  Daily Estimated energy needs: 1200-1600 calories for gradual weight loss of ~ 1-2 pound per week  Progress Towards Goal(s):  In progress.   Nutritional Diagnosis:  NB-1.1 Food and nutrition-related knowledge deficit As related to lack of prior sufficient meal planning training is improving  As evidenced by her report, questions and lack of knowledge.    Intervention:  Nutrition education about weight loss/behavior change/ food records.    Teaching Method Utilized: Visual,,Auditory,Hands on Handouts given during visit include: AVS and food records Barriers to learning/adherence to lifestyle change: lack of transportation Demonstrated degree of understanding via:  Teach Back   Monitoring/Evaluation:  Dietary intake, exercise, meter, and body weight in 1 month(s) vs class in 2 weeks

## 2016-06-20 NOTE — Assessment & Plan Note (Addendum)
History of present illness Patient states she is exercising every day walking 45 minutes on average to go to the grocery store and back. She is also watching her diet and drinking nutritional drinks, however, is disappointed that she is not losing any weight. Denies having any skin changes, hair changes, or constipation.  Assessment Patient has gained 1 pound since previous visit (201 pounds at present). Inability to lose weight could possibly be due to high caloric intake, however, hypothyroidism has to be ruled out.   Plan -TSH ordered -Educated patient about healthy eating and exercise. Emphasized the importance of weight loss.   Addendum 06/21/16: TSH normal. Advised patient to monitor her caloric intake and keep exercising. Patient is curently following up with our dietician [name] as well.

## 2016-06-20 NOTE — Assessment & Plan Note (Signed)
Assessment Patient is a current 1 pack per day 30+ year smoker.  I counseled her on smoking cessation and patient states she is ready to quit. States she has tried nicotine patches in the past and they don't stick to her skin. Also reports using her neighbor's Chantix and states it made her feel nauseous and sick.  Plan -Bupropion XL 150 mg once daily for 3 days, then Bupropion SR 150 mg twice daily. Discussed either setting her target quit date 1 week after initiation of therapy or cutting down by 1 cigarette a day until she stops using them completely (in about 20 days).  -Advised her to chew nicotine gum for cravings  -Reassess symptoms at follow-up visit

## 2016-06-20 NOTE — Assessment & Plan Note (Addendum)
BP Readings from Last 3 Encounters:  06/20/16 130/70  05/16/16 123/69  04/19/16 134/78    Lab Results  Component Value Date   NA 138 04/04/2014   K 3.4 (L) 04/04/2014   CREATININE 0.99 12/18/2014    Assessment: Blood pressure control:  well-controlled (below goal <140/90) Comments:  Currently taking Amlodipine 10 mg daily an Lisinopril-hydrochlorothiazide 20-25 mg daily. Reports being compliant with her medications.  Plan: Medications:  continue current medications Educational resources provided:   Educated patient about healthy eating and exercise. Emphasized the importance of weight loss.  Other plans:  -BMET to check renal function. Noted to have +1 bilateral lower extremity edema on exam.   Addendum 06/21/16: Cr 1.0 (at baseline). Lower extremity edema likely dependant in nature. Spoke to patient over the phone.  -Reassess at next visit

## 2016-06-20 NOTE — Assessment & Plan Note (Signed)
A Stable. Patient denies having any acid reflux at present and states the medication is helping control her symptoms.   P -Omeprazole 40 mg daily refilled

## 2016-06-20 NOTE — Assessment & Plan Note (Addendum)
A During previous visit, it was noted patient was on Fluphenazine. She was referred to psychiatry, however, has declined behavioral health referral. States "I am feeling good and my family does not think I need any help with my mental health." States she was taking Fluphenazine in the past due to marital problems but has stopped taking it now and her mood remains good.    P -CTM

## 2016-06-21 ENCOUNTER — Other Ambulatory Visit: Payer: Self-pay | Admitting: Internal Medicine

## 2016-06-21 ENCOUNTER — Telehealth: Payer: Self-pay | Admitting: Dietician

## 2016-06-21 LAB — MICROALBUMIN / CREATININE URINE RATIO
Creatinine, Urine: 83.6 mg/dL
MICROALB/CREAT RATIO: 415.1 mg/g{creat} — AB (ref 0.0–30.0)
MICROALBUM., U, RANDOM: 347 ug/mL

## 2016-06-21 LAB — BMP8+ANION GAP
Anion Gap: 17 mmol/L (ref 10.0–18.0)
BUN/Creatinine Ratio: 20 (ref 12–28)
BUN: 20 mg/dL (ref 8–27)
CALCIUM: 9.8 mg/dL (ref 8.7–10.3)
CHLORIDE: 99 mmol/L (ref 96–106)
CO2: 22 mmol/L (ref 18–29)
Creatinine, Ser: 1 mg/dL (ref 0.57–1.00)
GFR calc non Af Amer: 58 mL/min/{1.73_m2} — ABNORMAL LOW (ref >59)
GFR, EST AFRICAN AMERICAN: 67 mL/min/{1.73_m2} (ref >59)
GLUCOSE: 247 mg/dL — AB (ref 65–99)
Potassium: 4.8 mmol/L (ref 3.5–5.2)
Sodium: 138 mmol/L (ref 134–144)

## 2016-06-21 LAB — TSH: TSH: 1.14 u[IU]/mL (ref 0.450–4.500)

## 2016-06-21 MED ORDER — LISINOPRIL-HYDROCHLOROTHIAZIDE 20-12.5 MG PO TABS: 2.0000 | ORAL_TABLET | Freq: Every day | ORAL | 3 refills | Status: DC

## 2016-06-21 NOTE — Progress Notes (Signed)
Internal Medicine Clinic Attending  Case discussed with Dr. Rathoreat the time of the visit. We reviewed the resident's history and exam and pertinent patient test results. I agree with the assessment, diagnosis, and plan of care documented in the resident's note.  

## 2016-06-21 NOTE — Telephone Encounter (Signed)
Called requesting a return call- she cannot find a ride to the group MNTsession this month. She said she was devastated with her weight gain yesterday, but is feeling motivated again today and is keeping record of her calories.  Called her back- scheduled her for November 16th group session. Encouraged her to walk daily and continue recording what she eats.

## 2016-06-21 NOTE — Addendum Note (Signed)
Addended by: Shela Leff on: 06/21/2016 01:31 PM   Modules accepted: Orders

## 2016-07-13 ENCOUNTER — Ambulatory Visit: Payer: Medicare Other | Admitting: Podiatry

## 2016-07-17 NOTE — Addendum Note (Signed)
Addended by: Hulan Fray on: 07/17/2016 09:19 AM   Modules accepted: Orders

## 2016-07-21 ENCOUNTER — Other Ambulatory Visit: Payer: Self-pay | Admitting: Internal Medicine

## 2016-07-21 DIAGNOSIS — Z1231 Encounter for screening mammogram for malignant neoplasm of breast: Secondary | ICD-10-CM

## 2016-07-26 ENCOUNTER — Telehealth: Payer: Self-pay | Admitting: Dietician

## 2016-07-26 NOTE — Telephone Encounter (Signed)
Left message with anr appointment reminder

## 2016-07-27 ENCOUNTER — Ambulatory Visit (INDEPENDENT_AMBULATORY_CARE_PROVIDER_SITE_OTHER): Payer: Medicare Other | Admitting: Dietician

## 2016-07-27 DIAGNOSIS — Z713 Dietary counseling and surveillance: Secondary | ICD-10-CM | POA: Diagnosis not present

## 2016-07-27 DIAGNOSIS — Z794 Long term (current) use of insulin: Secondary | ICD-10-CM | POA: Diagnosis not present

## 2016-07-27 DIAGNOSIS — E119 Type 2 diabetes mellitus without complications: Secondary | ICD-10-CM

## 2016-07-27 DIAGNOSIS — E11649 Type 2 diabetes mellitus with hypoglycemia without coma: Secondary | ICD-10-CM

## 2016-07-27 NOTE — Progress Notes (Addendum)
Medical Nutrition Therapy:  Appt start time: [id number] end time:  1115. Visit # 4- today was group MNT  Assessment:  Primary concerns today: weight loss.  [name] is here for her 4th visit and it is a group. She continues to weigh at home,has been walking and trying to make healthier choices. She did not bring  food records. She reports that her sugar was in the 300s one day and that she had forgotten to take her insulin.  In the group she cooked and tasted a healthy pumpkin custard recipe today, shared successes and opportunities and ways to stay the course of choosing healthier options  during the holidays.   ANTHROPOMETRICS: weight- 193.7#, height-67", BMI-31, lost 8.4 pounds in past month MEDICATIONS: lantus-  25 units  BLOOD SUGAR: she forgot her meter DIETARY INTAKE: Glucerna and another low carb shakes as meal replacements and snacks not done in detail today  Daily Estimated energy needs: 1200-1600 calories for gradual weight loss of ~ 1-2 pound per week  Progress Towards Goal(s):  In progress.   Nutritional Diagnosis:  NB-1.1 Food and nutrition-related knowledge deficit As related to lack of prior sufficient meal planning training is improving  As evidenced by her report, questions and lack of knowledge.    Intervention:  Nutrition education about weight loss/behavior change, ways to stay focused on healthier lifestyle.    Teaching Method Utilized: Visual,,Auditory,Hands on Handouts given during visit include: AVS and food records Barriers to learning/adherence to lifestyle change: lack of transportation Demonstrated degree of understanding via:  Teach Back   Monitoring/Evaluation:  Dietary intake, exercise, meter, and body weight in 1 month(s)  As she cannot come on the day of group next month [place], [name], [place] 07/27/2016 1536

## 2016-07-27 NOTE — Patient Instructions (Signed)
Good to see you today and have you in the group!   You lost 8.5 pounds in the past 4 weeks- that is great success! Way to go!  Keep up the walking and healthier choices- even during the holidays!  I will be in touch about our visit in December- if you do not hear form me please feel free to call !  [name]  [phone]

## 2016-08-09 ENCOUNTER — Other Ambulatory Visit: Payer: Self-pay | Admitting: Internal Medicine

## 2016-08-12 ENCOUNTER — Other Ambulatory Visit: Payer: Self-pay | Admitting: Internal Medicine

## 2016-08-14 NOTE — Telephone Encounter (Signed)
Please confirm qty-per pharmacy the last rx was for #3 tabs.Regenia Skeeter, Ravon Mortellaro Cass[place]12/4/201710:36 AM

## 2016-08-22 ENCOUNTER — Encounter: Payer: Medicare Other | Admitting: Dietician

## 2016-08-22 ENCOUNTER — Ambulatory Visit (INDEPENDENT_AMBULATORY_CARE_PROVIDER_SITE_OTHER): Payer: Medicare Other | Admitting: Dietician

## 2016-08-22 ENCOUNTER — Ambulatory Visit (INDEPENDENT_AMBULATORY_CARE_PROVIDER_SITE_OTHER): Payer: Medicare Other | Admitting: Internal Medicine

## 2016-08-22 VITALS — BP 133/76 | HR 70 | Temp 97.9°F | Wt 193.6 lb

## 2016-08-22 DIAGNOSIS — Z794 Long term (current) use of insulin: Secondary | ICD-10-CM

## 2016-08-22 DIAGNOSIS — Z720 Tobacco use: Secondary | ICD-10-CM

## 2016-08-22 DIAGNOSIS — E11649 Type 2 diabetes mellitus with hypoglycemia without coma: Secondary | ICD-10-CM

## 2016-08-22 DIAGNOSIS — I10 Essential (primary) hypertension: Secondary | ICD-10-CM

## 2016-08-22 DIAGNOSIS — E785 Hyperlipidemia, unspecified: Secondary | ICD-10-CM | POA: Diagnosis not present

## 2016-08-22 DIAGNOSIS — L731 Pseudofolliculitis barbae: Secondary | ICD-10-CM

## 2016-08-22 DIAGNOSIS — F17210 Nicotine dependence, cigarettes, uncomplicated: Secondary | ICD-10-CM

## 2016-08-22 DIAGNOSIS — E1165 Type 2 diabetes mellitus with hyperglycemia: Secondary | ICD-10-CM | POA: Diagnosis not present

## 2016-08-22 DIAGNOSIS — Z79899 Other long term (current) drug therapy: Secondary | ICD-10-CM

## 2016-08-22 DIAGNOSIS — L299 Pruritus, unspecified: Secondary | ICD-10-CM | POA: Insufficient documentation

## 2016-08-22 LAB — GLUCOSE, CAPILLARY: GLUCOSE-CAPILLARY: 143 mg/dL — AB (ref 65–99)

## 2016-08-22 LAB — POCT GLYCOSYLATED HEMOGLOBIN (HGB A1C): HEMOGLOBIN A1C: 12.5

## 2016-08-22 MED ORDER — BUPROPION HCL ER (SR) 150 MG PO TB12: 150.0000 mg | ORAL_TABLET | Freq: Two times a day (BID) | ORAL | 0 refills | Status: DC

## 2016-08-22 MED ORDER — INSULIN GLARGINE 100 UNIT/ML ~~LOC~~ SOLN: 25.0000 [IU] | Freq: Every day | SUBCUTANEOUS | 1 refills | Status: DC

## 2016-08-22 MED ORDER — METFORMIN HCL 1000 MG PO TABS: 1000.0000 mg | ORAL_TABLET | Freq: Two times a day (BID) | ORAL | 3 refills | Status: DC

## 2016-08-22 MED ORDER — AMLODIPINE BESYLATE 10 MG PO TABS: 10.0000 mg | ORAL_TABLET | Freq: Every day | ORAL | 3 refills | Status: DC

## 2016-08-22 MED ORDER — HYDROCORTISONE 1 % EX CREA: TOPICAL_CREAM | CUTANEOUS | 0 refills | Status: DC

## 2016-08-22 NOTE — Progress Notes (Signed)
Medical Nutrition Therapy:  Appt start time: 110 end time:  130 PM. Visit # 5- today was individual follow up  Assessment:  Primary concerns today: weight loss.  [name] has cut back on her walking because she has not been feeling well. Her weight is the same which is acceptable over the holidays.  Her food recall contains high salt and fat foods. She was abel to get 2 food boxes to supplement her food budget.   ANTHROPOMETRICS: weight- 193.7#, height-67", BMI-30- obese class 1 MEDICATIONS: lantus-  25 units  BLOOD SUGAR: meter downloaded, A1C high at 12.5 DIETARY INTAKE:  Breakfast- coffee, nutrition drink lunch- salami or bologna sandwich on  Whole wheat bread, bouillon Snack- nutrition drink Dinner- sandwich and fruit or recently given to her: mac & cheese, chicken, rolls, green beans, potato salad  Daily Estimated energy needs: 1200-1600 calories for gradual weight loss of ~ 1-2 pound per week  Progress Towards Goal(s):  In progress.   Nutritional Diagnosis:  NB-1.1 Food and nutrition-related knowledge deficit As related to lack of prior sufficient meal planning training is improving  As evidenced by her report, questions and lack of knowledge.    Intervention:  Nutrition education about weight loss/behavior change, ways to stay focused on healthier lifestyle.   Coordination of care- consider  GLP 1 added on to insulin  Teaching Method Utilized: Visual,,Auditory,Hands on Handouts given during visit include: AVS and food records Barriers to learning/adherence to lifestyle change: lack of transportation Demonstrated degree of understanding via:  Teach Back   Monitoring/Evaluation:  Dietary intake, exercise, meter, and body weight in 1 month(s) or group [name], [name], [name] 08/22/2016 1536

## 2016-08-22 NOTE — Patient Instructions (Addendum)
Some tips to help you continue to decrease your weight and eat healthier...  Drink mostly water- at least 4-6 cups a day, 2 cups of milk and 1-2 cups each of tea and coffee, and maybe 1/2 cup juice per day.   Limit nutrition drinks to 1 a day   Instead of bouillon eat a vegetables like peas, carrots or fruit, like apples or applesauce for warmth- drink tea or heat up fruit or vegetable in the microwave.  When you are feeling better, walk for 15-30 minutes two times a day.    Instead of bologna or salami sandwiches try small amount of peanut butter, or [place] or chicken, or tuna or salmon.   Cook extra fish- tuna or salmon patties and chicken or meatloaf with lowfat ground beef to use for sandwiches.

## 2016-08-22 NOTE — Patient Instructions (Signed)
[name] it was nice seeing you today.  -Apply hydrocortisone cream to the external genital area as instructed.   -Please follow instructions on the handout to get rid of possible bedbugs in your house.  -Lantus dose has been increased: Use 25 units at bedtime  -Continue taking metformin as before  -Continue taking your blood pressure medications as before  -Return to the clinic in 4 weeks with your medications and diabetes meter.

## 2016-08-23 LAB — LIPID PANEL
CHOLESTEROL TOTAL: 150 mg/dL (ref 100–199)
Chol/HDL Ratio: 3.9 ratio units (ref 0.0–4.4)
HDL: 38 mg/dL — ABNORMAL LOW (ref >39)
LDL Calculated: 81 mg/dL (ref 0–99)
Triglycerides: 155 mg/dL — ABNORMAL HIGH (ref 0–149)
VLDL CHOLESTEROL CAL: 31 mg/dL (ref 5–40)

## 2016-08-23 MED ORDER — ATORVASTATIN CALCIUM 40 MG PO TABS: 40.0000 mg | ORAL_TABLET | Freq: Every day | ORAL | 3 refills | Status: DC

## 2016-08-25 DIAGNOSIS — E785 Hyperlipidemia, unspecified: Secondary | ICD-10-CM | POA: Insufficient documentation

## 2016-08-25 NOTE — Progress Notes (Signed)
Internal Medicine Clinic Attending  Case discussed with Dr. Rathore soon after the resident saw the patient.  We reviewed the resident's history and exam and pertinent patient test results.  I agree with the assessment, diagnosis, and plan of care documented in the resident's note.  

## 2016-08-25 NOTE — Assessment & Plan Note (Addendum)
Lab Results  Component Value Date   HGBA1C 12.5 08/22/2016   HGBA1C 6.7 05/16/2016   HGBA1C 6.7 02/08/2016     Assessment: Diabetes control:  above goal (A1c less than 7) Progress toward A1C goal:   deteriorated Comments: Patient is currently on metformin 1000 mg twice daily and Lantus 20 units at bedtime and reports compliance with her medications. However, review of chart reveals metformin was last prescribed in July 2015. Review of meter shows she has checked her CBGs 7 times in the past month; average value 212. Reports dietary indiscretions.  Plan: Medications:  Increase Lantus to 25 units at bedtime. Continue metformin as above. Home glucose monitoring: Frequency:  3 times a day Timing:  before meals Instruction/counseling given: reminded to get eye exam, reminded to bring blood glucose meter & log to each visit, reminded to bring medications to each visit, discussed foot care, discussed the need for weight loss and discussed diet Other plans:  -Patient has been advised to return to the clinic in 1 month with her meter. Consider increasing the dose of basal insulin at that visit.

## 2016-08-25 NOTE — Progress Notes (Signed)
   CC: Patient is complaining of pruritus.  HPI:  [name] is a 65 y.o. female with a past medical history of conditions listed below presenting to the clinic complaining of pruritus. Hypertension, diabetes, and tobacco use were also discussed during this visit. Please see problem based charting for the status of the patient's current and chronic medical conditions.   Past Medical History:  Diagnosis Date  . Allergy   . Anxiety   . Arthritis   . Asthma   . Depression   . Diabetes mellitus    type II  . Hyperlipidemia   . Hypertension   . Seizures ([employer])    last seizure Jan 2016    Review of Systems: Pertinent positives mentioned in HPI. Remainder of all ROS negative.   Physical Exam:  Vitals:   08/22/16 1328  BP: 133/76  Pulse: 70  Temp: 97.9 F (36.6 C)  SpO2: 99%  Weight: 193 lb 9.6 oz (87.8 kg)   Physical Exam  Constitutional: She is oriented to person, place, and time. She appears well-developed and well-nourished. No distress.  Cardiovascular: Normal rate, regular rhythm and intact distal pulses.   Pulmonary/Chest: Effort normal and breath sounds normal. No respiratory distress. She has no wheezes. She has no rales.  Abdominal: Soft. Bowel sounds are normal. She exhibits no distension. There is no tenderness. There is no guarding.  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  Musculoskeletal: Normal range of motion. She exhibits no edema.  Neurological: She is alert and oriented to person, place, and time.  Skin: Skin is warm and dry.  Scalp: No dandruff No bedbugs or lice noted No burrows noted in webbed spaces    Assessment & Plan:   See Encounters Tab for problem based charting.  Patient discussed with Dr. Daryll Drown

## 2016-08-25 NOTE — Assessment & Plan Note (Signed)
History of present illness Patient is complaining of pruritus in her scalp and pubic area for the past 2 weeks. Denies having pruritus anywhere else. States she was recently exposed to a friend whose mother might have bedbugs and since then she is afraid she might have them too. States she has been taking baths several times a day and washing these areas with Epsom salt. Also reports using lice treatment medications for a week but to no avail. She denies having any vaginal discharge. Does report shaving hair in her pubic region and using hair removal creams.  Assessment Pseudofolliculitis barbae noted in the pubic region. No dandruff noted on scalp. No bedbugs or lice noted. Scabies less likely as no burrows noted in webbed spaces.   Plan -Hydrocortisone 1% cream twice a day for short-term use only -Advised patient to stop using Epsom salt -Advised her to stop shaving hairs in her pubic region and stop using hair removal creams as they might be irritating her skin -Provided her with a handout on how to sterilize her house since she believes she might have bedbugs

## 2016-08-25 NOTE — Assessment & Plan Note (Signed)
BP Readings from Last 3 Encounters:  08/22/16 133/76  06/20/16 130/70  05/16/16 123/69    Lab Results  Component Value Date   NA 138 06/20/2016   K 4.8 06/20/2016   CREATININE 1.00 06/20/2016    Assessment: Blood pressure control:  below goal (<140/90) Comments: Patient is currently taking amlodipine 10 mg daily and lisinopril-hydrochlorothiazide 40-25 mg daily.  Plan: Medications:  continue current medications Educational resources provided:   Educated patient about healthy eating and exercise. Emphasized the importance of weight loss.

## 2016-08-25 NOTE — Assessment & Plan Note (Signed)
Assessment Patient states she has been taking Bupropion but still continues to smoke 1 pack per day. States she initially had cut down to a half-pack per day but then went back to 1 pack per day due to life stressors. States she does want to cut down on her smoking and wants to continue using Bupropion. States she previously failed nicotine patches and Chantix.  Plan -Continue bupropion -Reassess at next visit

## 2016-08-25 NOTE — Assessment & Plan Note (Signed)
Assessment Based on lipid panel checked at this visit, patient's 10 year ASCVD risk score is greater than 7.5%.  Plan -Start Lipitor 40 mg daily

## 2016-08-30 ENCOUNTER — Ambulatory Visit: Payer: Medicare Other

## 2016-08-31 ENCOUNTER — Ambulatory Visit: Payer: Medicare Other | Admitting: Dietician

## 2016-08-31 ENCOUNTER — Ambulatory Visit
Admission: RE | Admit: 2016-08-31 | Discharge: 2016-08-31 | Disposition: A | Discharge status: Home / Self Care | Payer: Medicare Other | Source: Ambulatory Visit | Attending: Internal Medicine | Admitting: Internal Medicine

## 2016-08-31 DIAGNOSIS — Z1231 Encounter for screening mammogram for malignant neoplasm of breast: Secondary | ICD-10-CM

## 2016-09-02 ENCOUNTER — Encounter (HOSPITAL_COMMUNITY): Payer: Self-pay | Admitting: Emergency Medicine

## 2016-09-02 ENCOUNTER — Emergency Department (HOSPITAL_COMMUNITY)
Admission: EM | Admit: 2016-09-02 | Discharge: 2016-09-02 | Disposition: A | Discharge status: Home / Self Care | Payer: Medicare Other | Attending: Emergency Medicine | Admitting: Emergency Medicine

## 2016-09-02 DIAGNOSIS — J45909 Unspecified asthma, uncomplicated: Secondary | ICD-10-CM | POA: Diagnosis not present

## 2016-09-02 DIAGNOSIS — N764 Abscess of vulva: Secondary | ICD-10-CM | POA: Insufficient documentation

## 2016-09-02 DIAGNOSIS — Z794 Long term (current) use of insulin: Secondary | ICD-10-CM | POA: Diagnosis not present

## 2016-09-02 DIAGNOSIS — F17210 Nicotine dependence, cigarettes, uncomplicated: Secondary | ICD-10-CM | POA: Insufficient documentation

## 2016-09-02 DIAGNOSIS — I10 Essential (primary) hypertension: Secondary | ICD-10-CM | POA: Diagnosis not present

## 2016-09-02 DIAGNOSIS — E1140 Type 2 diabetes mellitus with diabetic neuropathy, unspecified: Secondary | ICD-10-CM | POA: Insufficient documentation

## 2016-09-02 DIAGNOSIS — L0390 Cellulitis, unspecified: Secondary | ICD-10-CM

## 2016-09-02 DIAGNOSIS — L299 Pruritus, unspecified: Secondary | ICD-10-CM | POA: Diagnosis present

## 2016-09-02 LAB — COMPREHENSIVE METABOLIC PANEL
ALT: 25 U/L (ref 14–54)
ANION GAP: 9 (ref 5–15)
AST: 23 U/L (ref 15–41)
Albumin: 4.5 g/dL (ref 3.5–5.0)
Alkaline Phosphatase: 89 U/L (ref 38–126)
BUN: 18 mg/dL (ref 6–20)
CHLORIDE: 100 mmol/L — AB (ref 101–111)
CO2: 26 mmol/L (ref 22–32)
CREATININE: 1.05 mg/dL — AB (ref 0.44–1.00)
Calcium: 9.8 mg/dL (ref 8.9–10.3)
GFR, EST NON AFRICAN AMERICAN: 54 mL/min — AB (ref >60)
Glucose, Bld: 152 mg/dL — ABNORMAL HIGH (ref 65–99)
POTASSIUM: 4.4 mmol/L (ref 3.5–5.1)
SODIUM: 135 mmol/L (ref 135–145)
Total Bilirubin: 0.4 mg/dL (ref 0.3–1.2)
Total Protein: 7.7 g/dL (ref 6.5–8.1)

## 2016-09-02 LAB — URINALYSIS, ROUTINE W REFLEX MICROSCOPIC
Bacteria, UA: NONE SEEN
Bilirubin Urine: NEGATIVE
Glucose, UA: NEGATIVE mg/dL
HGB URINE DIPSTICK: NEGATIVE
Ketones, ur: NEGATIVE mg/dL
LEUKOCYTES UA: NEGATIVE
NITRITE: NEGATIVE
PH: 5 (ref 5.0–8.0)
Protein, ur: 30 mg/dL — AB
SPECIFIC GRAVITY, URINE: 1.011 (ref 1.005–1.030)

## 2016-09-02 LAB — WET PREP, GENITAL
Clue Cells Wet Prep HPF POC: NONE SEEN
SPERM: NONE SEEN
TRICH WET PREP: NONE SEEN
YEAST WET PREP: NONE SEEN

## 2016-09-02 MED ORDER — SULFAMETHOXAZOLE-TRIMETHOPRIM 800-160 MG PO TABS: 1.0000 | ORAL_TABLET | Freq: Two times a day (BID) | ORAL | 0 refills | Status: AC

## 2016-09-02 MED ORDER — HYDROXYZINE HCL 25 MG PO TABS
50.0000 mg | ORAL_TABLET | Freq: Once | ORAL | Status: AC
  Administered 2016-09-02: 50 mg via ORAL
  Filled 2016-09-02: qty 2

## 2016-09-02 MED ORDER — HYDROXYZINE HCL 25 MG PO TABS: 25.0000 mg | ORAL_TABLET | Freq: Three times a day (TID) | ORAL | 0 refills | Status: DC

## 2016-09-02 NOTE — ED Notes (Addendum)
Delay in obtaining vitals and triage, patient was outside smoking.

## 2016-09-02 NOTE — Discharge Instructions (Signed)
Please do not scratch anywhere. Take Atarax 3 times daily for itching. Take one tablet of Bactrim twice a day for 7 days. Continue using lotions and Epsom salt baths to soothe itching.  Contact a health care provider if: The itching does not go away after several days. You sweat at night. You have weight loss. You are unusually thirsty. You urinate more than normal. You are more tired than normal. You have abdominal pain. Your skin tingles. You feel weak. Your skin or the whites of your eyes look yellow (jaundice). Your skin feels numb.

## 2016-09-02 NOTE — ED Provider Notes (Signed)
[facility] DEPT Provider Note   CSN: [id number] Arrival date & time: 09/02/16  1327     History   Chief Complaint Chief Complaint  Patient presents with  . "Something's moving down there"    HPI [name] is a 65 y.o. female presenting with itchiness all over her body especially her head and groin area for the last 3 weeks. Patient reports things crawling under her skin and are not visible in her very itchy. Patient reports trying multiple things such as Epsom salts baths and lice medication over-the-counter which has not helped. She has been seen by her primary care physician and said that it may be bedbugs. She says that she continues to scratch areas that are itchy. Patient admits to intercourse 5 weeks ago and states that she thinks she had a yeast infection and treated herself with over-the-counter medication. She states that she was feeling better with the medication. She then had intercourse with a new partner 3 weeks ago when symptoms started. Patient denies any vaginal pain, discharge, bleeding. Patient patient denies chest pain, shortness of breath, fevers, chills.  HPI  Past Medical History:  Diagnosis Date  . Allergy   . Anxiety   . Arthritis   . Asthma   . Depression   . Diabetes mellitus    type II  . Hyperlipidemia   . Hypertension   . Seizures [facility])    last seizure Jan 2016    Patient Active Problem List   Diagnosis Date Noted  . Hyperlipidemia 08/25/2016  . Itching 08/22/2016  . Obesity 06/20/2016  . Health care maintenance 05/17/2016  . Skin rash 05/17/2016  . Diabetic peripheral neuropathy associated with type 2 diabetes mellitus ([place]) 05/17/2016  . GERD (gastroesophageal reflux disease) 05/17/2016  . Tobacco use 05/17/2016  . Colon polyps 05/05/2016  . Screening for colon cancer 02/10/2016  . Back pain of thoracolumbar region 11/08/2015  . Awareness alteration, transient 12/18/2014  . DM type 2 (diabetes mellitus, type 2) ([place]) 05/03/2012    . Arthritis 05/03/2012  . Hypertension 05/03/2012  . Chronic pain 05/03/2012  . Anxiety 05/03/2012    Past Surgical History:  Procedure Laterality Date  . APPENDECTOMY    . CYSTECTOMY    . FOOT SURGERY Bilateral 2001   x3  . SALPINGOOPHORECTOMY Left   . TUBAL LIGATION      OB History    Gravida Para Term Preterm AB Living   4 2 2   2 2    SAB TAB Ectopic Multiple Live Births   2       2       Home Medications    Prior to Admission medications   Medication Sig Start Date End Date Taking? Authorizing Provider  amLODipine (NORVASC) 10 MG tablet Take 1 tablet (10 mg total) by mouth daily. 08/22/16   Shela Leff, MD  atorvastatin (LIPITOR) 40 MG tablet Take 1 tablet (40 mg total) by mouth daily. 08/23/16 08/23/17  Shela Leff, MD  buPROPion (WELLBUTRIN SR) 150 MG 12 hr tablet Take 1 tablet (150 mg total) by mouth 2 (two) times daily. 08/22/16   Shela Leff, MD  gabapentin (NEURONTIN) 800 MG tablet TAKE 1 TABLET BY MOUTH THREE TIMES DAILY 08/13/15   Max T Hyatt, DPM  glucose blood (ONETOUCH VERIO) test strip Use to check your blood sugar two times a day 02/09/16   Shela Leff, MD  hydrocortisone cream 1 % Apply to affected area 2 times daily. 08/22/16 08/22/17  Shela Leff, MD  hydrOXYzine (ATARAX/VISTARIL) 25 MG tablet Take 1 tablet (25 mg total) by mouth 3 (three) times daily. 09/02/16   Lorean Ekstrand Manuel [place], [place]  insulin glargine (LANTUS) 100 UNIT/ML injection Inject 0.25 mLs (25 Units total) into the skin at bedtime. 08/22/16   Shela Leff, MD  Insulin Syringe-Needle U-100 31G X 15/64" 0.3 ML MISC Use to inject lantus insulin one time a day 02/09/16   Shela Leff, MD  lisinopril-hydrochlorothiazide (ZESTORETIC) 20-12.5 MG tablet Take 2 tablets by mouth daily. 06/21/16 06/21/17  Shela Leff, MD  metFORMIN (GLUCOPHAGE) 1000 MG tablet Take 1 tablet (1,000 mg total) by mouth 2 (two) times daily with a meal. 08/22/16   Shela Leff,  MD  omeprazole (PRILOSEC) 40 MG capsule Take 1 capsule (40 mg total) by mouth daily. 06/20/16   Shela Leff, MD  [facility] DELICA LANCETS FINE MISC Use to check your blood sugar two times a day 02/09/16   Shela Leff, MD  sulfamethoxazole-trimethoprim (BACTRIM DS,SEPTRA DS) 800-160 MG tablet Take 1 tablet by mouth 2 (two) times daily. 09/02/16 09/09/16  Bettey Costa, [place]    Family History Family History  Problem Relation Age of Onset  . Heart disease Mother   . Diabetes Mother   . Depression Mother   . Cancer Father   . Diabetes Father   . Depression Father   . Colon cancer Father   . Diabetes Sister   . Kidney disease Brother   . Diabetes Brother   . Kidney disease Brother   . Diabetes Brother   . Diabetes Brother   . Diabetes Brother   . Colon cancer Brother   . Esophageal cancer Neg Hx   . Rectal cancer Neg Hx   . Stomach cancer Neg Hx   . Pancreatic cancer Neg Hx     Social History Social History  Substance Use Topics  . Smoking status: Current Every Day Smoker    Packs/day: 1.00    Types: Cigarettes  . Smokeless tobacco: Never Used  . Alcohol use 0.0 oz/week     Comment: occassional glass of wine     Allergies   Aspirin; Other; and Chocolate   Review of Systems Review of Systems  Constitutional: Negative for chills and fever.  HENT: Negative for sore throat.   Eyes: Negative for visual disturbance.  Respiratory: Negative for shortness of breath.   Cardiovascular: Negative for chest pain.  Gastrointestinal: Negative for abdominal pain, constipation, diarrhea and vomiting.  Genitourinary: Negative for difficulty urinating, dysuria, pelvic pain, vaginal bleeding, vaginal discharge and vaginal pain.       Groin and Vaginal itching  Musculoskeletal: Negative for back pain.  Skin: Positive for rash and wound.  Neurological: Negative for numbness and headaches.     Physical Exam Updated Vital Signs BP 118/74 (BP Location: Right Arm)    Pulse 87   Temp 98.6 F (37 C) (Oral)   Resp 18   Ht 5' 6.5" (1.689 m)   Wt 87.1 kg   SpO2 95%   BMI 30.53 kg/m   Physical Exam  Constitutional: She is oriented to person, place, and time. She appears well-developed and well-nourished.  HENT:  Head: Normocephalic and atraumatic.  Nose: Nose normal.  Mouth/Throat: Oropharynx is clear and moist.  Eyes: Conjunctivae and EOM are normal. Pupils are equal, round, and reactive to light.  Neck: Normal range of motion. Neck supple.  Cardiovascular: Normal rate and normal heart sounds.   Pulmonary/Chest: Effort normal and breath sounds normal. No respiratory distress. She  exhibits no tenderness.  Abdominal: Soft. Bowel sounds are normal. There is no tenderness. There is no rebound and no guarding.  Genitourinary: Vagina normal and uterus normal. Pelvic exam was performed with patient supine. There is tenderness (To lesion) and lesion (One lesion, white discharge in center. ) on the right labia. There is tenderness (to lesion) and lesion (one lesion, white discharge in center. ) on the left labia. Cervix exhibits discharge. Cervix exhibits no motion tenderness. Right adnexum displays no mass and no tenderness. Left adnexum displays no mass and no tenderness. No erythema, tenderness or bleeding in the vagina. No foreign body in the vagina. No signs of injury around the vagina. No vaginal discharge found.  Genitourinary Comments: Chaperoned.    Musculoskeletal: Normal range of motion.  Lymphadenopathy:       Right: No inguinal adenopathy present.       Left: No inguinal adenopathy present.  Neurological: She is alert and oriented to person, place, and time.  Skin: Skin is warm. Capillary refill takes less than 2 seconds. Rash noted.  Rashes throughout body, face, groin, and extremities secondary to scratching.   Psychiatric: She has a normal mood and affect. Her behavior is normal.  Nursing note and vitals reviewed.    ED Treatments / Results    Labs (all labs ordered are listed, but only abnormal results are displayed) Labs Reviewed  WET PREP, GENITAL - Abnormal; Notable for the following:       Result Value   WBC, Wet Prep HPF POC FEW (*)    All other components within normal limits  COMPREHENSIVE METABOLIC PANEL - Abnormal; Notable for the following:    Chloride 100 (*)    Glucose, Bld 152 (*)    Creatinine, Ser 1.05 (*)    GFR calc non Af Amer 54 (*)    All other components within normal limits  URINALYSIS, ROUTINE W REFLEX MICROSCOPIC - Abnormal; Notable for the following:    Protein, ur 30 (*)    Squamous Epithelial / LPF 0-5 (*)    All other components within normal limits  RPR  HIV ANTIBODY (ROUTINE TESTING)  GC/CHLAMYDIA PROBE AMP ([place]) NOT AT [facility]    EKG  EKG Interpretation None       Radiology No results found.  Procedures Procedures (including critical care time)  Medications Ordered in ED Medications  hydrOXYzine (ATARAX/VISTARIL) tablet 50 mg (50 mg Oral Given 09/02/16 2102)     Initial Impression / Assessment and Plan / ED Course  I have reviewed the triage vital signs and the nursing notes.  Pertinent labs & imaging results that were available during my care of the patient were reviewed by me and considered in my medical decision making (see chart for details).  Clinical Course   Patient is a 65 year old female presenting with pruritus 3 weeks. On exam afebrile, with stable, and in no apparent distress. Heart and lung sounds are clear. Abdomen soft and nontender. patient has rashes throughout body, face, neck, extremities, and labia majora. Exam performed by me and exam chaperoned. Patient had a lesion with slight discharge and tenderness to palpation on left and right labia majora. Pelvic was otherwise normal. Vagina normal. With normal color and no lesions. Cervix had a slight white discharge which may be normal baseline. No lesions noted. Cervical motion tenderness absent. Cervical  os closed. Wet prep and DNA probe for chlamydia and GC obtained. No adnexal tenderness or masses noted. Wet prep negative for yeast, trichomoniasis, clue  cells. Lab work does not show the findings and consistent with previous lab work. LFT's within normal limits. UA does not show evidence of infection. Patient will be treated for cellulitis and given Atarax for alleviation of itching symptoms. Patient agreeable with discharge, assessment and plan. Patient given strict instructions to stop scratching and to follow-up with primary care physician in 2-5 days. Return precautions given for any new or worsening symptoms such as fever, chills, shortness of breath, chest pain, increased redness in the area, and discharge.  Final Clinical Impressions(s) / ED Diagnoses   Final diagnoses:  Cellulitis, unspecified cellulitis site  Itching    New Prescriptions Discharge Medication List as of 09/02/2016  9:07 PM    START taking these medications   Details  hydrOXYzine (ATARAX/VISTARIL) 25 MG tablet Take 1 tablet (25 mg total) by mouth 3 (three) times daily., Starting Sat 09/02/2016, Print    sulfamethoxazole-trimethoprim (BACTRIM DS,SEPTRA DS) 800-160 MG tablet Take 1 tablet by mouth 2 (two) times daily., Starting Sat 09/02/2016, Until Sat 09/09/2016, [place], [place] 09/02/16 2139    Duffy Bruce, MD 09/04/16 1134

## 2016-09-02 NOTE — ED Triage Notes (Signed)
Pt states that she believes her neighbor gave her the flu.  When asked what her symptoms are, pt states that she is "feeling something moving down there", pointing to her groin area.  Pt states that she has been treating herself for lice and ringworm and thinks that she may have crabs too.  When asked specifically if she had any flu symptoms--cough, sore throat, fever, etc, pt denies.  States that she has been "queasy" on her stomach but has not thrown up nor does she have diarrhea.

## 2016-09-03 LAB — HIV ANTIBODY (ROUTINE TESTING W REFLEX): HIV Screen 4th Generation wRfx: NONREACTIVE

## 2016-09-03 LAB — RPR: RPR Ser Ql: NONREACTIVE

## 2016-09-05 ENCOUNTER — Ambulatory Visit: Payer: Medicare Other | Admitting: Obstetrics

## 2016-09-05 LAB — GC/CHLAMYDIA PROBE AMP (~~LOC~~) NOT AT ARMC
CHLAMYDIA, DNA PROBE: NEGATIVE
NEISSERIA GONORRHEA: NEGATIVE

## 2016-09-12 ENCOUNTER — Other Ambulatory Visit: Payer: Self-pay | Admitting: Podiatry

## 2016-09-12 ENCOUNTER — Other Ambulatory Visit: Payer: Self-pay | Admitting: Internal Medicine

## 2016-09-13 ENCOUNTER — Other Ambulatory Visit: Payer: Self-pay | Admitting: *Deleted

## 2016-09-13 MED ORDER — ACCU-CHEK SOFT TOUCH LANCETS MISC: 5 refills | Status: DC

## 2016-09-13 MED ORDER — ACCU-CHEK AVIVA PLUS W/DEVICE KIT
PACK | 0 refills | Status: DC
Start: 2016-09-13 — End: 2016-10-09

## 2016-09-13 MED ORDER — GLUCOSE BLOOD VI STRP: ORAL_STRIP | 5 refills | Status: DC

## 2016-09-13 NOTE — Telephone Encounter (Signed)
[employer], [place] Rossetti will not cover pt's current meter/supplies. Need to be changed to Accu-chek Aviva plus. Also no more than 5 refills at one time.

## 2016-09-19 ENCOUNTER — Encounter: Payer: Self-pay | Admitting: Obstetrics

## 2016-09-19 ENCOUNTER — Ambulatory Visit (INDEPENDENT_AMBULATORY_CARE_PROVIDER_SITE_OTHER): Payer: Medicare Other | Admitting: Obstetrics

## 2016-09-19 ENCOUNTER — Other Ambulatory Visit (HOSPITAL_COMMUNITY)
Admission: RE | Admit: 2016-09-19 | Discharge: 2016-09-19 | Disposition: A | Discharge status: Home / Self Care | Payer: Medicare HMO | Source: Ambulatory Visit | Attending: Obstetrics | Admitting: Obstetrics

## 2016-09-19 VITALS — BP 135/74 | HR 93 | Wt 193.8 lb

## 2016-09-19 DIAGNOSIS — Z0000 Encounter for general adult medical examination without abnormal findings: Secondary | ICD-10-CM | POA: Diagnosis not present

## 2016-09-19 DIAGNOSIS — Z01419 Encounter for gynecological examination (general) (routine) without abnormal findings: Secondary | ICD-10-CM | POA: Insufficient documentation

## 2016-09-19 DIAGNOSIS — Z1151 Encounter for screening for human papillomavirus (HPV): Secondary | ICD-10-CM | POA: Insufficient documentation

## 2016-09-19 DIAGNOSIS — B369 Superficial mycosis, unspecified: Secondary | ICD-10-CM

## 2016-09-19 DIAGNOSIS — L739 Follicular disorder, unspecified: Secondary | ICD-10-CM

## 2016-09-19 DIAGNOSIS — Z124 Encounter for screening for malignant neoplasm of cervix: Secondary | ICD-10-CM

## 2016-09-19 MED ORDER — CLOTRIMAZOLE 1 % EX CREA: 1.0000 "application " | TOPICAL_CREAM | Freq: Two times a day (BID) | CUTANEOUS | 1 refills | Status: DC

## 2016-09-19 MED ORDER — TRIPLE ANTIBIOTIC 5-400-5000 EX OINT: TOPICAL_OINTMENT | Freq: Two times a day (BID) | CUTANEOUS | 1 refills | Status: DC

## 2016-09-19 NOTE — Progress Notes (Signed)
Patient was seen at ED- was given Vistaril - patient requested follow up. Patient is out of the medication and patient feels she needs the medication for pain and itching.  Patient was tested for everything and she was negative.

## 2016-09-19 NOTE — Progress Notes (Signed)
Subjective:        [name] is a 66 y.o. female here for a routine exam.  Current complaints: Vulva itching.    Personal health questionnaire:  Is patient Ashkenazi Jewish, have a family history of breast and/or ovarian cancer: no Is there a family history of uterine cancer diagnosed at age < 87, gastrointestinal cancer, urinary tract cancer, family member who is a Field seismologist syndrome-associated carrier: no Is the patient overweight and hypertensive, family history of diabetes, personal history of gestational diabetes, preeclampsia or PCOS: no Is patient over 41, have PCOS,  family history of premature CHD under age 14, diabetes, smoke, have hypertension or peripheral artery disease:  no At any time, has a partner hit, kicked or otherwise hurt or frightened you?: no Over the past 2 weeks, have you felt down, depressed or hopeless?: no Over the past 2 weeks, have you felt little interest or pleasure in doing things?:no   Gynecologic History No LMP recorded. Patient is postmenopausal. Contraception: post menopausal status Last Pap: 2016. Results were: normal Last mammogram. 2017 Results were: normal  Obstetric History OB History  Gravida Para Term Preterm AB Living  _0 SAB TAB Ectopic Multiple Live Births  2       2    # Outcome Date GA Lbr Len/2nd Weight Sex Delivery Anes PTL Lv  4 Term 09/01/81   7 lb 7 oz (3.374 kg) F    LIV  3 Term 02/20/68   6 lb 4 oz (2.835 kg) M    LIV  2 SAB           1 SAB               Past Medical History:  Diagnosis Date  . Allergy   . Anxiety   . Arthritis   . Asthma   . Depression   . Diabetes mellitus    type II  . Hyperlipidemia   . Hypertension   . Seizures ([place])    last seizure Jan 2016    Past Surgical History:  Procedure Laterality Date  . APPENDECTOMY    . CYSTECTOMY    . FOOT SURGERY Bilateral 2001   x3  . SALPINGOOPHORECTOMY Left   . TUBAL LIGATION       Current Outpatient Prescriptions:  .  amLODipine  (NORVASC) 10 MG tablet, Take 1 tablet (10 mg total) by mouth daily., Disp: 90 tablet, Rfl: 3 .  atorvastatin (LIPITOR) 40 MG tablet, Take 1 tablet (40 mg total) by mouth daily., Disp: 90 tablet, Rfl: 3 .  Blood Glucose Monitoring Suppl (ACCU-CHEK AVIVA PLUS) w/Device KIT, Use to check blood sugar two times a day. Dx code E11.9., Disp: 1 kit, Rfl: 0 .  buPROPion (WELLBUTRIN SR) 150 MG 12 hr tablet, TAKE 1 TABLET BY MOUTH TWICE DAILY, Disp: 60 tablet, Rfl: 0 .  gabapentin (NEURONTIN) 800 MG tablet, TAKE 1 TABLET BY MOUTH THREE TIMES DAILY, Disp: 180 tablet, Rfl: 1 .  glucose blood (ACCU-CHEK AVIVA PLUS) test strip, Use to check blood sugar two times a day. Dx code E11.9., Disp: 100 each, Rfl: 5 .  hydrocortisone cream 1 %, Apply to affected area 2 times daily., Disp: 30 g, Rfl: 0 .  insulin glargine (LANTUS) 100 UNIT/ML injection, Inject 0.25 mLs (25 Units total) into the skin at bedtime., Disp: 30 mL, Rfl: 1 .  Insulin Syringe-Needle U-100 31G X 15/64" 0.3 ML MISC, Use to inject lantus insulin  one time a day, Disp: 100 each, Rfl: 11 .  Lancets (ACCU-CHEK SOFT TOUCH) lancets, Use to check blood sugar two times a day. Dx code E11.9., Disp: 100 each, Rfl: 5 .  lisinopril-hydrochlorothiazide (ZESTORETIC) 20-12.5 MG tablet, Take 2 tablets by mouth daily., Disp: 180 tablet, Rfl: 3 .  metFORMIN (GLUCOPHAGE) 1000 MG tablet, Take 1 tablet (1,000 mg total) by mouth 2 (two) times daily with a meal., Disp: 180 tablet, Rfl: 3 .  omeprazole (PRILOSEC) 40 MG capsule, Take 1 capsule (40 mg total) by mouth daily., Disp: 90 capsule, Rfl: 1 .  hydrOXYzine (ATARAX/VISTARIL) 25 MG tablet, Take 1 tablet (25 mg total) by mouth 3 (three) times daily. (Patient not taking: Reported on 09/19/2016), Disp: 20 tablet, Rfl: 0  Current Facility-Administered Medications:  .  0.9 %  sodium chloride infusion, 500 mL, Intravenous, Continuous, Nelida Meuse III, MD Allergies  Allergen Reactions  . Aspirin Anaphylaxis    Swelling in  mouth, throat and hives  . Other Itching and Swelling    Antibiotic (name unknown).  Occurred three years ago with prescription antibiotic.  Marland Kitchen Chocolate Hives and Rash    Social History  Substance Use Topics  . Smoking status: Current Every Day Smoker    Packs/day: 0.50    Types: Cigarettes  . Smokeless tobacco: Never Used  . Alcohol use 0.0 oz/week     Comment: occassional glass of wine    Family History  Problem Relation Age of Onset  . Heart disease Mother   . Diabetes Mother   . Depression Mother   . Cancer Father   . Diabetes Father   . Depression Father   . Colon cancer Father   . Diabetes Sister   . Kidney disease Brother   . Diabetes Brother   . Kidney disease Brother   . Diabetes Brother   . Diabetes Brother   . Diabetes Brother   . Colon cancer Brother   . Esophageal cancer Neg Hx   . Rectal cancer Neg Hx   . Stomach cancer Neg Hx   . Pancreatic cancer Neg Hx       Review of Systems  Constitutional: negative for fatigue and weight loss Respiratory: negative for cough and wheezing Cardiovascular: negative for chest pain, fatigue and palpitations Gastrointestinal: negative for abdominal pain and change in bowel habits Musculoskeletal:negative for myalgias Neurological: negative for gait problems and tremors Behavioral/Psych: negative for abusive relationship, depression Endocrine: negative for temperature intolerance    Genitourinary:negative for abnormal menstrual periods, genital lesions, hot flashes, sexual problems and vaginal discharge Integument/breast: negative for breast lump, breast tenderness, nipple discharge and skin lesion(s)    Objective:       BP 135/74   Pulse 93   Wt 193 lb 12.8 oz (87.9 kg)   BMI 30.81 kg/m  General:   alert  Skin:   no rash or abnormalities  Lungs:   clear to auscultation bilaterally  Heart:   regular rate and rhythm, S1, S2 normal, no murmur, click, rub or gallop  Breasts:   normal without suspicious masses,  skin or nipple changes or axillary nodes  Abdomen:  normal findings: no organomegaly, soft, non-tender and no hernia  Pelvis:  External genitalia: healing areas of folliculitis Urinary system: urethral meatus normal and bladder without fullness, nontender Vaginal: normal without tenderness, induration or masses Cervix: normal appearance Adnexa: normal bimanual exam Uterus: anteverted and non-tender, normal size   Lab Review Urine pregnancy test Labs reviewed yes Radiologic studies reviewed  yes  50% of 20 min visit spent on counseling and coordination of care.    Assessment:    Healthy female exam.    Folliculitis of Vulva  Postmenopause.  Doing well.   Plan:    Education reviewed: calcium supplements, depression evaluation, low fat, low cholesterol diet, self breast exams and weight bearing exercise. Follow up in: 1 year.   No orders of the defined types were placed in this encounter.  No orders of the defined types were placed in this encounter.    Patient ID: [name], female   DOB: 01/01/1951, 65 y.o.   MRN: [id number]  

## 2016-09-20 LAB — CYTOLOGY - PAP
DIAGNOSIS: NEGATIVE
HPV (WINDOPATH): NOT DETECTED

## 2016-10-09 ENCOUNTER — Other Ambulatory Visit: Payer: Self-pay | Admitting: Internal Medicine

## 2016-10-09 DIAGNOSIS — I10 Essential (primary) hypertension: Secondary | ICD-10-CM

## 2016-10-11 MED ORDER — AMLODIPINE BESYLATE 10 MG PO TABS: 10.0000 mg | ORAL_TABLET | Freq: Every day | ORAL | 3 refills | Status: DC

## 2016-10-11 NOTE — Telephone Encounter (Signed)
Also received refill request for pt's amlodipine.Despina Hidden Cassady1/31/20183:37 PM

## 2016-10-23 ENCOUNTER — Ambulatory Visit (INDEPENDENT_AMBULATORY_CARE_PROVIDER_SITE_OTHER): Payer: Medicare HMO | Admitting: Podiatry

## 2016-10-23 ENCOUNTER — Encounter: Payer: Self-pay | Admitting: Podiatry

## 2016-10-23 DIAGNOSIS — B351 Tinea unguium: Secondary | ICD-10-CM | POA: Diagnosis not present

## 2016-10-23 DIAGNOSIS — E1140 Type 2 diabetes mellitus with diabetic neuropathy, unspecified: Secondary | ICD-10-CM | POA: Diagnosis not present

## 2016-10-23 DIAGNOSIS — M79675 Pain in left toe(s): Secondary | ICD-10-CM

## 2016-10-23 DIAGNOSIS — Q828 Other specified congenital malformations of skin: Secondary | ICD-10-CM

## 2016-10-23 DIAGNOSIS — M79674 Pain in right toe(s): Secondary | ICD-10-CM | POA: Diagnosis not present

## 2016-10-23 NOTE — Progress Notes (Signed)
Subjective: 66 y.o. returns the office today for painful, elongated, thickened toenails which she cannot trim herself. Denies any redness or drainage around the nails. She also has calluses to both of her feet. Denies any acute changes since last appointment and no new complaints today. Denies any systemic complaints such as fevers, chills, nausea, vomiting.   Objective: AAO 3, NAD DP/PT pulses palpable, CRT less than 3 seconds Protective sensation decreased with Simms Weinstein monofilament, Achilles tendon reflex intact.  Nails hypertrophic, dystrophic, elongated, brittle, discolored 10. There is tenderness overlying the nails 1-5 bilaterally. There is no surrounding erythema or drainage along the nail sites. Hyperkeratotic lesions to bilateral plantar/posterior heel and right submetatarsal 3. No underlying ulceration, drainage, or signs of infection No open lesions or pre-ulcerative lesions are identified. No other areas of tenderness bilateral lower extremities. No overlying edema, erythema, increased warmth. No pain with calf compression, swelling, warmth, erythema.  Assessment: Patient presents with symptomatic onychomycosis, hyperkeratotic lesions  Plan: -Treatment options including alternatives, risks, complications were discussed -Nails sharply debrided 10 without complication/bleeding. -Hyperkeratotic lesion sharply debrided 3 without complications or bleeding. -Discussed daily foot inspection. If there are any changes, to call the office immediately.  -Follow-up in 3 months or sooner if any problems are to arise. In the meantime, encouraged to call the office with any questions, concerns, changes symptoms.  Celesta Gentile, DPM

## 2016-10-31 ENCOUNTER — Ambulatory Visit (INDEPENDENT_AMBULATORY_CARE_PROVIDER_SITE_OTHER): Payer: Medicare HMO | Admitting: Internal Medicine

## 2016-10-31 ENCOUNTER — Ambulatory Visit (INDEPENDENT_AMBULATORY_CARE_PROVIDER_SITE_OTHER): Payer: Medicare HMO | Admitting: Dietician

## 2016-10-31 VITALS — BP 117/66 | HR 78 | Temp 98.2°F | Ht 67.0 in | Wt 190.0 lb

## 2016-10-31 DIAGNOSIS — Z6829 Body mass index (BMI) 29.0-29.9, adult: Secondary | ICD-10-CM | POA: Diagnosis not present

## 2016-10-31 DIAGNOSIS — E118 Type 2 diabetes mellitus with unspecified complications: Secondary | ICD-10-CM

## 2016-10-31 DIAGNOSIS — K219 Gastro-esophageal reflux disease without esophagitis: Secondary | ICD-10-CM

## 2016-10-31 DIAGNOSIS — Z794 Long term (current) use of insulin: Secondary | ICD-10-CM

## 2016-10-31 DIAGNOSIS — E11649 Type 2 diabetes mellitus with hypoglycemia without coma: Secondary | ICD-10-CM

## 2016-10-31 DIAGNOSIS — Z1211 Encounter for screening for malignant neoplasm of colon: Secondary | ICD-10-CM | POA: Insufficient documentation

## 2016-10-31 DIAGNOSIS — E119 Type 2 diabetes mellitus without complications: Secondary | ICD-10-CM | POA: Diagnosis not present

## 2016-10-31 DIAGNOSIS — Z713 Dietary counseling and surveillance: Secondary | ICD-10-CM | POA: Diagnosis not present

## 2016-10-31 DIAGNOSIS — B369 Superficial mycosis, unspecified: Secondary | ICD-10-CM

## 2016-10-31 DIAGNOSIS — Z79899 Other long term (current) drug therapy: Secondary | ICD-10-CM | POA: Diagnosis not present

## 2016-10-31 DIAGNOSIS — I10 Essential (primary) hypertension: Secondary | ICD-10-CM

## 2016-10-31 DIAGNOSIS — Z720 Tobacco use: Secondary | ICD-10-CM

## 2016-10-31 DIAGNOSIS — F17210 Nicotine dependence, cigarettes, uncomplicated: Secondary | ICD-10-CM

## 2016-10-31 DIAGNOSIS — Z13820 Encounter for screening for osteoporosis: Secondary | ICD-10-CM | POA: Insufficient documentation

## 2016-10-31 LAB — GLUCOSE, CAPILLARY: Glucose-Capillary: 157 mg/dL — ABNORMAL HIGH (ref 65–99)

## 2016-10-31 LAB — POCT GLYCOSYLATED HEMOGLOBIN (HGB A1C): Hemoglobin A1C: 11.1

## 2016-10-31 MED ORDER — OMEPRAZOLE 40 MG PO CPDR: 40.0000 mg | DELAYED_RELEASE_CAPSULE | Freq: Every day | ORAL | 1 refills | Status: DC

## 2016-10-31 MED ORDER — GABAPENTIN 800 MG PO TABS: 800.0000 mg | ORAL_TABLET | Freq: Three times a day (TID) | ORAL | 1 refills | Status: DC

## 2016-10-31 MED ORDER — BUPROPION HCL ER (SR) 150 MG PO TB12: 150.0000 mg | ORAL_TABLET | Freq: Two times a day (BID) | ORAL | 0 refills | Status: DC

## 2016-10-31 MED ORDER — CLOTRIMAZOLE 1 % EX CREA: 1.0000 "application " | TOPICAL_CREAM | Freq: Two times a day (BID) | CUTANEOUS | 1 refills | Status: DC

## 2016-10-31 MED ORDER — ACCU-CHEK SOFT TOUCH LANCETS MISC: 3 refills | Status: DC

## 2016-10-31 MED ORDER — GLUCOSE BLOOD VI STRP: ORAL_STRIP | 3 refills | Status: DC

## 2016-10-31 NOTE — Progress Notes (Signed)
Medical Nutrition Therapy:  Appt start time: 215 end time:  245 PM. Visit # 6- today was individual follow up  Assessment:  Primary concerns today: weight loss.  [name] has lost 3.7# by doing her indoor exercsing daily and walking when she can and trying to eat healthier.  Her food recall is reasonable in salt and fatty foods. She reports excess amount of sleep. She is still getting  food boxes to supplement her food budget.   ANTHROPOMETRICS: weight- 190#, height-67", BMI-29- no longer in obese category! MEDICATIONS: lantus-  25 units , metformin 1000 mg twice daily SLEEP: bedtime 9-11 and sleep 5-7 hours, arises ~ 5 Am makes coffee and goes back to sleep until 7-10 AM ,naps daily from 3-5 PM.  BLOOD SUGAR: meter downloaded- only 3 values, last A1C12.5, blood sugar today is 157 after lunch which is acceptable DIETARY INTAKE:  Breakfast- coffee,  lunch- salami or bologna sandwich on  Whole wheat bread, bouillon Snack- nutrition drink Dinner- sandwich and fruit or recently given to her: mac & cheese, chicken, rolls, green beans, potato salad  Daily Estimated energy needs: 1200-1600 calories for gradual weight loss of ~ 1-2 pound per week  Progress Towards Goal(s):  In progress.   Nutritional Diagnosis:  NB-1.1 Food and nutrition-related knowledge deficit As related to lack of prior sufficient meal planning training is improving  As evidenced by her report, questions and weight loss.    Intervention:  Nutrition education about weight loss/behavior change, ways to stay focused on healthier lifestyle.   Coordination of care- consider  GLP 1 added on to insulin  Teaching Method Utilized: Visual,,Auditory,Hands on Handouts given during visit include: AVS and food records Barriers to learning/adherence to lifestyle change: lack of transportation Demonstrated degree of understanding via:  Teach Back   Monitoring/Evaluation:  Dietary intake, exercise, meter, and body weight in 1 month(s) or  group [place], [name], [place] 10/31/2016 1536

## 2016-10-31 NOTE — Assessment & Plan Note (Signed)
Office Visit (02/08/2016)  - Shela Leff, MD    A:  Patient is due for a screening colonoscopy.   P: Screening colonoscopy ordered.

## 2016-10-31 NOTE — Assessment & Plan Note (Signed)
Office Visit (05/16/2016)  - Shela Leff, MD    Dexa scan ordered

## 2016-10-31 NOTE — Patient Instructions (Signed)
Ms. Wardwell it was nice seeing you today.  -Use Lantus 30 units at bedtime  -Return to the clinic in 1 month with your meter.

## 2016-10-31 NOTE — Patient Instructions (Signed)
Eating CHEESE  Is the same protein as eating meat.  You lost 3# in the past two months by exercisng and walking and making healthy choices like eating fruit, yogurt, soup with vegetables, low fat foods.  Your blood sugars have been higher than usual- try to drink water or tea instead of juice or soda. Maybe you could ask your neighbor will buy you a diet soda?  Healthy snacks-  Yogurt-yoplait, greek yogurt or what is on sale with less than 20 grams of sugar  Fruit- bananas- when it gets too sweet peel it and freeze for later  Vegetables- sliced peppers, celery carrots, cucumbers  prunes,  nuts - walnuts, peanuts,  half peanut butter sandwich  celery and peanut butter

## 2016-11-01 DIAGNOSIS — B369 Superficial mycosis, unspecified: Secondary | ICD-10-CM | POA: Insufficient documentation

## 2016-11-01 NOTE — Assessment & Plan Note (Addendum)
Pt is requesting a refill on Clotrimazole. States it was recently prescribed to her by her gynecologist for a skin infection in her pubic region. States this medication is helping her a lot.   -Medication has been refilled. Patient was in a rush and examination of external genitalia could not be performed at this visit.

## 2016-11-01 NOTE — Assessment & Plan Note (Signed)
A Patient continues to use Bupropion and has cut down from 1PPD to 2 cigs/ per day. I discussed the importance of complete cessation.   P -Bupropion has been refilled

## 2016-11-01 NOTE — Assessment & Plan Note (Signed)
Lab Results  Component Value Date   HGBA1C 11.1 10/31/2016   HGBA1C 12.5 08/22/2016   HGBA1C 6.7 05/16/2016     Assessment: Diabetes control:  poorly controlled  Progress toward A1C goal:   improved  Comments: Currently on Lantus 25 u QHS and Metformin 1000 mg BID. Meter showing avg 257, however, patient has checked her CBG for only 2 days in total. States she an eye exam scheduled at [facility] on 01/13/17.   Plan: Medications: Increase Lantus to 30 u  QHS. Continue Metformin as above. Home glucose monitoring: Frequency:  3x per day Timing:  before meals  Instruction/counseling given: reminded to get eye exam, reminded to bring blood glucose meter & log to each visit, reminded to bring medications to each visit, discussed foot care, discussed the need for weight loss and discussed diet Other plans:  -Advised to return to the clinic in 1 month with meter

## 2016-11-01 NOTE — Progress Notes (Signed)
   CC: Pt is here to discuss HTN, DM, tobacco use, and GERD.   HPI:  [name] is a 66 y.o. F with a PMHx of conditions listed below presenting to the clinic to discuss HTN, DM, tobacco use, and GERD. Please see problem based charting for the status of the patient's current and chronic medical conditions.   Past Medical History:  Diagnosis Date  . Allergy   . Anxiety   . Arthritis   . Asthma   . Depression   . Diabetes mellitus    type II  . Hyperlipidemia   . Hypertension   . Seizures ([place])    last seizure Jan 2016    Review of Systems:  Pertinent positives mentioned in HPI. Remainder of all ROS negative.   Physical Exam:  Vitals:   10/31/16 1518  BP: 117/66  Pulse: 78  Temp: 98.2 F (36.8 C)  TempSrc: Oral  SpO2: 99%  Weight: 190 lb (86.2 kg)  Height: 5\' 7"  (1.702 m)   Physical Exam  Constitutional: She is oriented to person, place, and time. She appears well-developed and well-nourished. No distress.  HENT:  Head: Normocephalic and atraumatic.  Eyes: EOM are normal.  Neck: Neck supple. No tracheal deviation present.  Cardiovascular: Normal rate, regular rhythm and intact distal pulses.  Exam reveals no gallop and no friction rub.   Pulmonary/Chest: Effort normal and breath sounds normal. No respiratory distress. She has no wheezes. She has no rales.  Abdominal: Soft. Bowel sounds are normal. She exhibits no distension. There is no tenderness.  Musculoskeletal: Normal range of motion. She exhibits no edema.  Neurological: She is alert and oriented to person, place, and time.  Skin: Skin is warm and dry.    Assessment & Plan:   See Encounters Tab for problem based charting.  Patient discussed with Dr. Evette Doffing

## 2016-11-02 NOTE — Progress Notes (Signed)
Internal Medicine Clinic Attending  Case discussed with Dr. Rathoreat the time of the visit. We reviewed the resident's history and exam and pertinent patient test results. I agree with the assessment, diagnosis, and plan of care documented in the resident's note.  

## 2016-11-02 NOTE — Assessment & Plan Note (Signed)
Omeprazole is helping control her symptoms.  -Medication has been refilled per pt request

## 2016-11-06 ENCOUNTER — Telehealth: Payer: Self-pay

## 2016-11-06 NOTE — Telephone Encounter (Signed)
[name] from [facility] needs to speak with a nurse regarding meds to be filled. Please call back.

## 2016-11-08 NOTE — Telephone Encounter (Signed)
Pt calling to check status  °

## 2016-11-08 NOTE — Telephone Encounter (Signed)
Rec'd call from [facility] Mickel Baas) Pharmacy Tech requesting  A call back to [phone]

## 2016-11-09 NOTE — Telephone Encounter (Signed)
Rtc, they were checking to see where pt's meds were sent for the last 6 mon

## 2016-11-20 ENCOUNTER — Other Ambulatory Visit: Payer: Self-pay | Admitting: *Deleted

## 2016-11-20 ENCOUNTER — Telehealth: Payer: Self-pay

## 2016-11-20 DIAGNOSIS — I10 Essential (primary) hypertension: Secondary | ICD-10-CM

## 2016-11-20 DIAGNOSIS — B369 Superficial mycosis, unspecified: Secondary | ICD-10-CM

## 2016-11-20 DIAGNOSIS — Z794 Long term (current) use of insulin: Secondary | ICD-10-CM

## 2016-11-20 DIAGNOSIS — Z720 Tobacco use: Secondary | ICD-10-CM

## 2016-11-20 DIAGNOSIS — E11649 Type 2 diabetes mellitus with hypoglycemia without coma: Secondary | ICD-10-CM

## 2016-11-20 NOTE — Telephone Encounter (Signed)
Called pt, changing to [facility], request sent to md

## 2016-11-20 NOTE — Telephone Encounter (Signed)
Needs to speak with a nurse about meds. Please call back.  

## 2016-11-21 MED ORDER — INSULIN GLARGINE 100 UNIT/ML ~~LOC~~ SOLN: 30.0000 [IU] | Freq: Every day | SUBCUTANEOUS | 3 refills | Status: DC

## 2016-11-21 MED ORDER — GABAPENTIN 800 MG PO TABS: 800.0000 mg | ORAL_TABLET | Freq: Three times a day (TID) | ORAL | 1 refills | Status: DC

## 2016-11-21 MED ORDER — ATORVASTATIN CALCIUM 40 MG PO TABS: 40.0000 mg | ORAL_TABLET | Freq: Every day | ORAL | 3 refills | Status: DC

## 2016-11-21 MED ORDER — GLUCOSE BLOOD VI STRP: ORAL_STRIP | 3 refills | Status: DC

## 2016-11-21 MED ORDER — AMLODIPINE BESYLATE 10 MG PO TABS: 10.0000 mg | ORAL_TABLET | Freq: Every day | ORAL | 3 refills | Status: DC

## 2016-11-21 MED ORDER — ACCU-CHEK SOFT TOUCH LANCETS MISC: 3 refills | Status: DC

## 2016-11-21 MED ORDER — "INSULIN SYRINGE-NEEDLE U-100 31G X 15/64"" 0.3 ML MISC": 11 refills | Status: DC

## 2016-11-21 MED ORDER — BUPROPION HCL ER (SR) 150 MG PO TB12
150.0000 mg | ORAL_TABLET | Freq: Two times a day (BID) | ORAL | 0 refills | Status: DC
Start: 2016-11-21 — End: 2016-12-28

## 2016-11-21 MED ORDER — OMEPRAZOLE 40 MG PO CPDR: 40.0000 mg | DELAYED_RELEASE_CAPSULE | Freq: Every day | ORAL | 1 refills | Status: DC

## 2016-11-21 MED ORDER — METFORMIN HCL 1000 MG PO TABS: 1000.0000 mg | ORAL_TABLET | Freq: Two times a day (BID) | ORAL | 3 refills | Status: DC

## 2016-11-21 MED ORDER — LISINOPRIL-HYDROCHLOROTHIAZIDE 20-12.5 MG PO TABS: 2.0000 | ORAL_TABLET | Freq: Every day | ORAL | 3 refills | Status: DC

## 2016-11-23 ENCOUNTER — Ambulatory Visit (INDEPENDENT_AMBULATORY_CARE_PROVIDER_SITE_OTHER): Payer: Medicare HMO | Admitting: Dietician

## 2016-11-23 DIAGNOSIS — E11649 Type 2 diabetes mellitus with hypoglycemia without coma: Secondary | ICD-10-CM

## 2016-11-23 DIAGNOSIS — Z6829 Body mass index (BMI) 29.0-29.9, adult: Secondary | ICD-10-CM

## 2016-11-23 DIAGNOSIS — Z713 Dietary counseling and surveillance: Secondary | ICD-10-CM

## 2016-11-23 DIAGNOSIS — Z794 Long term (current) use of insulin: Secondary | ICD-10-CM | POA: Diagnosis not present

## 2016-11-23 NOTE — Progress Notes (Addendum)
Medical Nutrition Therapy:  Appt start time: 7408 end time:  1448 PM. Visit # 7- group  Assessment:  Primary concerns today: weight loss.  [name] attended group diabetes and weight management medical nutrition therapy class today.  We discussed success, challenges, goals, diabetes Myths and facts using the diabetes conversation maps.   Her goal is to continue good health habits, talk to doctor about medicines, eat healthy snacks and walk for 30 minutes a day.   Patient reports her lantus insulin is costing her 245 per month for lantus, her siblings gave her some. Informed pharmacy. BLOOD SUGAR: Brought meter today:  Average 153 for past 30 days, 3 lows out of 45 readings all occurring in the evening. I do not see anything on her medicine profile that might be causing the lows.  Question of caused by  Activity vs Alcohol vs increased dose of Lantus ?   ANTHROPOMETRICS: weight- 193#, height-67", BMI-29- MEDICATIONS: lantus-  30 units , metformin 1000 mg twice daily  Daily Estimated energy needs: 1200-1600 calories for gradual weight loss of ~ 1-2 pound per week  Progress Towards Goal(s):  In progress.   Nutritional Diagnosis:  NB-1.1 Food and nutrition-related knowledge deficit As related to lack of prior sufficient meal planning training is improving  As evidenced by her report, questions and weight loss.    Intervention:  Nutrition education about weight diabetes myths and facts.    Coordination of care- consider  GLP 1 added on to insulin  Teaching Method Utilized: Visual,,Auditory,Hands on Handouts given during visit include: AVS and food records Barriers to learning/adherence to lifestyle change: lack of transportation Demonstrated degree of understanding via:  Teach Back   Monitoring/Evaluation:  Dietary intake, exercise, meter, and body weight in 1 month(s) or group [place], [name], [place] 11/23/2016 1536

## 2016-11-24 ENCOUNTER — Telehealth: Payer: Self-pay | Admitting: *Deleted

## 2016-11-24 NOTE — Telephone Encounter (Signed)
Received fax from [facility] with the following statement: " Rx for Accu-chek soft touch but the patient prefers accu-chek softclix lancets.  May we send Accu-chek softclix lancets with a 90 day supply, test 3 times daily, 3 refills? Yes or No?"  Will forward request to pcp for review, please advise.Despina Hidden Cassady3/16/20181:50 PM

## 2016-11-25 NOTE — Telephone Encounter (Signed)
Yes. Thanks 

## 2016-11-28 ENCOUNTER — Ambulatory Visit: Payer: Medicare HMO | Admitting: Pharmacist

## 2016-11-28 ENCOUNTER — Encounter: Payer: Medicare HMO | Admitting: Internal Medicine

## 2016-12-04 ENCOUNTER — Telehealth: Payer: Self-pay | Admitting: Internal Medicine

## 2016-12-04 ENCOUNTER — Other Ambulatory Visit: Payer: Self-pay | Admitting: *Deleted

## 2016-12-04 MED ORDER — ACCU-CHEK SOFTCLIX LANCET DEV MISC: 3 refills | Status: DC

## 2016-12-04 NOTE — Telephone Encounter (Signed)
Received refill request from pt's pharmacy for hydroxyzine hcl 25mg  tablets.   Medication no longer on pt's profile.  May have been discontinued during 10/31/2016 OV.  Will forward request to pcp for review, please advise.Regenia Skeeter, Darlene Cassady3/26/201811:12 AM

## 2016-12-04 NOTE — Telephone Encounter (Signed)
Called and spoke to someone else, they ask for accu-chek softclix and that is what was sent, she stated she made a note of that, call ended

## 2016-12-04 NOTE — Telephone Encounter (Signed)
Mernia from the [facility] asking per the patient requesting to change the type of lancets she uses.  Please call back to the pharmacy number listed.

## 2016-12-05 ENCOUNTER — Telehealth: Payer: Self-pay | Admitting: Internal Medicine

## 2016-12-05 NOTE — Telephone Encounter (Signed)
APT. REMINDER CALL, LMTCB °

## 2016-12-06 ENCOUNTER — Ambulatory Visit (INDEPENDENT_AMBULATORY_CARE_PROVIDER_SITE_OTHER): Payer: Medicare HMO | Admitting: Internal Medicine

## 2016-12-06 ENCOUNTER — Ambulatory Visit: Payer: Medicare HMO | Admitting: Pharmacist

## 2016-12-06 VITALS — BP 149/73 | HR 88 | Temp 97.9°F | Ht 66.5 in | Wt 190.8 lb

## 2016-12-06 DIAGNOSIS — Z794 Long term (current) use of insulin: Secondary | ICD-10-CM

## 2016-12-06 DIAGNOSIS — E119 Type 2 diabetes mellitus without complications: Secondary | ICD-10-CM

## 2016-12-06 DIAGNOSIS — Z79899 Other long term (current) drug therapy: Secondary | ICD-10-CM

## 2016-12-06 DIAGNOSIS — E1142 Type 2 diabetes mellitus with diabetic polyneuropathy: Secondary | ICD-10-CM

## 2016-12-06 DIAGNOSIS — R52 Pain, unspecified: Secondary | ICD-10-CM

## 2016-12-06 DIAGNOSIS — F419 Anxiety disorder, unspecified: Secondary | ICD-10-CM | POA: Diagnosis not present

## 2016-12-06 DIAGNOSIS — E11649 Type 2 diabetes mellitus with hypoglycemia without coma: Secondary | ICD-10-CM

## 2016-12-06 MED ORDER — HYDROXYZINE HCL 25 MG PO TABS: 25.0000 mg | ORAL_TABLET | Freq: Every day | ORAL | 0 refills | Status: DC

## 2016-12-06 MED ORDER — HYDROXYZINE HCL 25 MG PO TABS: 25.0000 mg | ORAL_TABLET | Freq: Every day | ORAL | 0 refills | Status: DC | PRN

## 2016-12-06 NOTE — Patient Instructions (Addendum)
[name],  It was a pleasure to see you today. For your diabetes, please continue to take your lantus 30 units in the evenings and metformin twice daily. Please follow up with your PCP for your scheduled appointment in May. I have given you a refill of your Vistaril. Please take this as needed for your anxiety. If you have any questions or concerns, call our clinic at [phone] or after hours call [phone] and ask for the internal medicine resident on call. Thank you!  - Dr. Sharyn Dross

## 2016-12-06 NOTE — Assessment & Plan Note (Addendum)
Patient is complaining of diffuse "head to toe" body pain that is constant and chronic. She was unable to further characterize her pain for me. She is requesting a prescription for tramadol. She was given this medication by another doctor many years ago and reports that it helped. I discussed with patient that opioid pain medication is not appropriate for chronic diffuse pain. I explained that there are multiple adverse side effects to medicines like tramadol that outweigh any potential benefit. Patient has been taking PRN Tylenol without relief. I advised patient to take NSAIDs like ibuprofen or naproxen but she reports that she cannot take these medications because they make her swell. I offered physical therapy referral for strengthening exercises but patient declined.  -- Follow-up with PCP.

## 2016-12-06 NOTE — Assessment & Plan Note (Addendum)
Patient is here for diabetes follow-up. She was seen in clinic one month ago and her Lantus was increased from 25 to 30 units daily at bedtime. She is also prescribed metformin 1000 MG twice a day. Today patient reports that she's been using only 25 units of her insulin. When asked about her recent dose change, patient replied "well maybe it's 30 units, I can't remember". She also reports that she has not been able to afford to fill her insulin due to a high insurance deductible. She has been using her granddaughter's and her neighbor's Lantus. She says her neighbor recently changed insulin regimens and no longer needed his lantus. She has a full box of lantus pens with her today (expiration date Feb 2019). She says she has no issue using the pens. Of note, patient was eating bag of potato chips during her visit today. I briefly dicussed dietary modifications with patient today and recommended limiting her carb intake. Patient was unaware of the high carbohydrates in potato chips. Patient has met with our diabetic educator in the past. I will have her follow up for a second appointment. She is meeting with our clinic pharmacists today.  -- pharmacy consult for insurance coverage issues; appreciate assistance  -- Continue lantus 30 QHS, metformin 1000 mg BID -- Follow up with PCP for scheduled appointment in May  -- Follow up with DM educator for nutrition

## 2016-12-06 NOTE — Progress Notes (Signed)
   CC: DM follow up  HPI:  [name] is a 65 y.o. female with past medical history outlined below here for DM follow up. For the details of today's visit, please refer to the assessment and plan.  Past Medical History:  Diagnosis Date  . Allergy   . Anxiety   . Arthritis   . Asthma   . Depression   . Diabetes mellitus    type II  . Hyperlipidemia   . Hypertension   . Seizures ([place])    last seizure Jan 2016    Review of Systems:  All pertinents listed in HPI, otherwise negative  Physical Exam:  Vitals:   12/06/16 1431  BP: (!) 149/73  Pulse: 88  Temp: 97.9 F (36.6 C)  TempSrc: Oral  SpO2: 99%  Weight: 190 lb 12.8 oz (86.5 kg)  Height: 5' 6.5" (1.689 m)    Constitutional: NAD, appears comfortable Cardiovascular: RRR Pulmonary/Chest: CTAB Abdominal: Soft, non tender, non distended. +BS.  Extremities: Warm and well perfused. No edema.  Neurological: A&Ox3, CN II - XII grossly intact.    Assessment & Plan:   See Encounters Tab for problem based charting.  Patient discussed with Dr. Evette Doffing

## 2016-12-06 NOTE — Progress Notes (Addendum)
S: [name] is a 66 y.o. female reports to clinical pharmacist appointment for medication management. Patient did not bring medication bottles, but had Lantus pens. Patient is not accompanied by any family and manages her own medications.  Allergies  Allergen Reactions  . Aspirin Anaphylaxis    Swelling in mouth, throat and hives  . Other Itching and Swelling    Antibiotic (name unknown).  Occurred three years ago with prescription antibiotic.  Marland Kitchen Chocolate Hives and Rash   Medication Sig  amLODipine (NORVASC) 10 MG tablet Take 1 tablet (10 mg total) by mouth daily.  atorvastatin (LIPITOR) 40 MG tablet Take 1 tablet (40 mg total) by mouth daily.  Blood Glucose Monitoring Suppl (ACCU-CHEK AVIVA PLUS) w/Device KIT USE   TO CHECK GLUCOSE TWICE DAILY  buPROPion (WELLBUTRIN SR) 150 MG 12 hr tablet Take 1 tablet (150 mg total) by mouth 2 (two) times daily.  clotrimazole (LOTRIMIN) 1 % cream Apply 1 application topically 2 (two) times daily.  gabapentin (NEURONTIN) 800 MG tablet Take 1 tablet (800 mg total) by mouth 3 (three) times daily.  glucose blood (ACCU-CHEK AVIVA PLUS) test strip Use to check blood sugar 3 times a day. Dx code E11.9.  hydrOXYzine (ATARAX/VISTARIL) 25 MG tablet Take 1 tablet (25 mg total) by mouth daily as needed for anxiety.  hydrOXYzine (ATARAX/VISTARIL) 25 MG tablet Take 1 tablet (25 mg total) by mouth daily. For anxiety  insulin glargine (LANTUS) 100 UNIT/ML injection Inject 0.3 mLs (30 Units total) into the skin at bedtime.  Insulin Syringe-Needle U-100 31G X 15/64" 0.3 ML MISC Use to inject lantus insulin one time a day  Lancet Devices (ACCU-CHEK SOFTCLIX) lancets .Use to test blood glucose 3 times daily. Dx: E11.49  Lancets (ACCU-CHEK SOFT TOUCH) lancets Use to check blood sugar 3 times a day. Dx code E11.9.  lisinopril-hydrochlorothiazide (ZESTORETIC) 20-12.5 MG tablet Take 2 tablets by mouth daily.  metFORMIN (GLUCOPHAGE) 1000 MG tablet Take 1 tablet (1,000 mg  total) by mouth 2 (two) times daily with a meal.  neomycin-bacitracin-polymyxin (NEOSPORIN) [phone] ointment Apply topically 2 (two) times daily.  omeprazole (PRILOSEC) 40 MG capsule Take 1 capsule (40 mg total) by mouth daily.   Past Medical History:  Diagnosis Date  . Allergy   . Anxiety   . Arthritis   . Asthma   . Depression   . Diabetes mellitus    type II  . Hyperlipidemia   . Hypertension   . Seizures [facility])    last seizure Jan 2016   Social History   Social History  . Marital status: Single    Spouse name: N/A  . Number of children: N/A  . Years of education: N/A   Social History Main Topics  . Smoking status: Current Every Day Smoker    Packs/day: 0.50    Types: Cigarettes  . Smokeless tobacco: Never Used     Comment: DOWN TO 3 CIGARETTES DAY  . Alcohol use 0.0 oz/week     Comment: occassional glass of wine  . Drug use: No  . Sexual activity: Not Currently    Partners: Male    Birth control/ protection: Post-menopausal   Other Topics Concern  . Not on file   Social History Narrative  . No narrative on file   Family History  Problem Relation Age of Onset  . Heart disease Mother   . Diabetes Mother   . Depression Mother   . Cancer Father   . Diabetes Father   . Depression Father   .  Colon cancer Father   . Diabetes Sister   . Kidney disease Brother   . Diabetes Brother   . Kidney disease Brother   . Diabetes Brother   . Diabetes Brother   . Diabetes Brother   . Colon cancer Brother   . Esophageal cancer Neg Hx   . Rectal cancer Neg Hx   . Stomach cancer Neg Hx   . Pancreatic cancer Neg Hx     O:    Component Value Date/Time   CHOL 150 08/22/2016 1449   HDL 38 (L) 08/22/2016 1449   TRIG 155 (H) 08/22/2016 1449   AST 23 09/02/2016 1942   ALT 25 09/02/2016 1942   NA 135 09/02/2016 1942   NA 138 06/20/2016 1547   K 4.4 09/02/2016 1942   CL 100 (L) 09/02/2016 1942   CO2 26 09/02/2016 1942   GLUCOSE 152 (H) 09/02/2016 1942   HGBA1C  11.1 10/31/2016 1552   BUN 18 09/02/2016 1942   BUN 20 06/20/2016 1547   CREATININE 1.05 (H) 09/02/2016 1942   CREATININE 0.99 12/18/2014 1444   CALCIUM 9.8 09/02/2016 1942   GFRNONAA 54 (L) 09/02/2016 1942   GFRAA >60 09/02/2016 1942   WBC 7.6 04/04/2014 2328   HGB 13.9 04/04/2014 2328   HCT 39.9 04/04/2014 2328   PLT 268 04/04/2014 2328   TSH 1.140 06/20/2016 1547   Ht Readings from Last 2 Encounters:  12/06/16 5' 6.5" (1.689 m)  10/31/16 '5\' 7"'  (1.702 m)   Wt Readings from Last 2 Encounters:  12/06/16 190 lb 12.8 oz (86.5 kg)  11/23/16 193 lb 1.6 oz (87.6 kg)   There is no height or weight on file to calculate BMI. BP Readings from Last 3 Encounters:  12/06/16 (!) 149/73  10/31/16 117/66  09/19/16 135/74     A/P: A drug regimen assessment was performed, including review of allergies, interactions, disease-state management, dosing and immunization history. Medications were reviewed with the patient, including name, instructions, indication, goals of therapy, potential side effects, importance of adherence, and safe use.  Findings/Recommendations:   Patient struggles to afford Lantus until she reaches her deductible, but she said it is easy to afford and manage other medications.  Pharmacy is looking for other options that will be no cost for diabetes management. However, medications were not changed today.   Options on her insurance: glipizide and pioglitazone  We rechecked her blood pressure, but it was still above goal because the SBP was 140 exactly. However, we did not have time to re-check as her transportation had arrived. BP would likely be controlled if she were not worked up.  The patient verbalized understanding of information provided by repeating back concepts discussed.   30 minutes spent face-to-face with the patient during the encounter. 50% of time spent on education. 50% of time was spent on discussing medication affordability and a trial the patient is  considering trying to enroll in.

## 2016-12-06 NOTE — Assessment & Plan Note (Signed)
Patient requesting Valium today for her "nerves". When asked about anxiety symptoms, patient adamantly denied that she had anxiety or depression. Patient reported that she simply has "bad nerves" and needs medicine to help calm them. I discussed with patient that I this is not an appropriate medication for chornic daily anxiety and suggested starting an SSRI. Again, patient was resistant to this idea and insisted that she does not anxiety. Patient reported that an ED provide her previously prescribed hydroxyzine that she felt was helpful and is requested a refill of that instead of Valium. Patient was given 30 tablets of hydroxyzine to use as needed. Follow-up with PCP. -- Hydroxyzine 25 mg daily prn (#30 tabs) -- Follow up with PCP

## 2016-12-07 NOTE — Progress Notes (Signed)
Patient was seen in clinic with Florinda Marker, PharmD candidate. I agree with the assessment and plan of care documented.

## 2016-12-07 NOTE — Progress Notes (Signed)
Internal Medicine Clinic Attending  Case discussed with Dr. Guilloud at the time of the visit.  We reviewed the resident's history and exam and pertinent patient test results.  I agree with the assessment, diagnosis, and plan of care documented in the resident's note.  

## 2016-12-14 ENCOUNTER — Telehealth: Payer: Self-pay | Admitting: Dietician

## 2016-12-14 NOTE — Telephone Encounter (Signed)
Wants to know if friend can come to class/group and can we mail her information to give to him on meal planing for weight loss. Mailed to her today and encouraged her to bring a friend. Reminded her that pharmacy will be leading group this month.

## 2016-12-15 ENCOUNTER — Other Ambulatory Visit: Payer: Self-pay | Admitting: *Deleted

## 2016-12-15 DIAGNOSIS — F419 Anxiety disorder, unspecified: Secondary | ICD-10-CM

## 2016-12-15 DIAGNOSIS — B369 Superficial mycosis, unspecified: Secondary | ICD-10-CM

## 2016-12-15 MED ORDER — HYDROXYZINE HCL 25 MG PO TABS: 25.0000 mg | ORAL_TABLET | Freq: Every day | ORAL | 0 refills | Status: DC | PRN

## 2016-12-15 NOTE — Telephone Encounter (Signed)
Provided refill of Vistaril to get to appointment with PCP on 01/09/17. It appears Clotrimazole was prescribed by OB/GYN, will defer this prescrpition as she may need to be seen back at GYN

## 2016-12-23 DIAGNOSIS — E785 Hyperlipidemia, unspecified: Secondary | ICD-10-CM | POA: Diagnosis not present

## 2016-12-23 DIAGNOSIS — E669 Obesity, unspecified: Secondary | ICD-10-CM | POA: Diagnosis not present

## 2016-12-23 DIAGNOSIS — K219 Gastro-esophageal reflux disease without esophagitis: Secondary | ICD-10-CM | POA: Diagnosis not present

## 2016-12-23 DIAGNOSIS — I10 Essential (primary) hypertension: Secondary | ICD-10-CM | POA: Diagnosis not present

## 2016-12-23 DIAGNOSIS — Z833 Family history of diabetes mellitus: Secondary | ICD-10-CM | POA: Diagnosis not present

## 2016-12-23 DIAGNOSIS — E1142 Type 2 diabetes mellitus with diabetic polyneuropathy: Secondary | ICD-10-CM | POA: Diagnosis not present

## 2016-12-23 DIAGNOSIS — F17210 Nicotine dependence, cigarettes, uncomplicated: Secondary | ICD-10-CM | POA: Diagnosis not present

## 2016-12-23 DIAGNOSIS — Z809 Family history of malignant neoplasm, unspecified: Secondary | ICD-10-CM | POA: Diagnosis not present

## 2016-12-23 DIAGNOSIS — Z8249 Family history of ischemic heart disease and other diseases of the circulatory system: Secondary | ICD-10-CM | POA: Diagnosis not present

## 2016-12-23 DIAGNOSIS — F419 Anxiety disorder, unspecified: Secondary | ICD-10-CM | POA: Diagnosis not present

## 2016-12-23 DIAGNOSIS — L988 Other specified disorders of the skin and subcutaneous tissue: Secondary | ICD-10-CM | POA: Diagnosis not present

## 2016-12-23 DIAGNOSIS — E1165 Type 2 diabetes mellitus with hyperglycemia: Secondary | ICD-10-CM | POA: Diagnosis not present

## 2016-12-28 ENCOUNTER — Other Ambulatory Visit: Payer: Self-pay | Admitting: Pharmacist

## 2016-12-28 ENCOUNTER — Encounter: Payer: Self-pay | Admitting: Pharmacist

## 2016-12-28 ENCOUNTER — Ambulatory Visit (INDEPENDENT_AMBULATORY_CARE_PROVIDER_SITE_OTHER): Payer: Medicare HMO | Admitting: Pharmacist

## 2016-12-28 VITALS — Wt 188.5 lb

## 2016-12-28 DIAGNOSIS — Z713 Dietary counseling and surveillance: Secondary | ICD-10-CM | POA: Diagnosis not present

## 2016-12-28 DIAGNOSIS — Z6829 Body mass index (BMI) 29.0-29.9, adult: Secondary | ICD-10-CM | POA: Diagnosis not present

## 2016-12-28 DIAGNOSIS — E118 Type 2 diabetes mellitus with unspecified complications: Secondary | ICD-10-CM | POA: Diagnosis not present

## 2016-12-28 DIAGNOSIS — Z719 Counseling, unspecified: Secondary | ICD-10-CM

## 2016-12-28 DIAGNOSIS — Z720 Tobacco use: Secondary | ICD-10-CM

## 2016-12-28 NOTE — Progress Notes (Signed)
Medical Nutrition Therapy: Appt start time: 1015end time: 1115. Last visit 12/06/16  Assessment: Primary concerns today: weight and participating in this program. The patient felt bad that she was late today because she really wants to participate. Patient attended Group Medical Nutrition Therapy visit today. Education included successes, challenges,goals, participation in a [employer] discussion about monitoring blood glucose and symptoms of high and low blood glucose.   Her goal is to: eat breakfast every day, walk every day, and keep coming to diabetes class.   SELF MONITORING-High was 278, low 65, average 149 Labs: most recent A1C is 11.1% (10/31/16) DIETARY INTAKE: Patient has still been snacking on "junk food". She enjoyed the humus and cucumber on flatbread today Weight- Today: 188.5lbs  Progress Towards Goal(s): Progress made.  Nutritional Diagnosis:   [place] 2.2 altered nutrition related laboratory values of A1C of >7% as related to higher blood sugar remains about the same as evidenced her A1C of 11.1%.  Intervention: Group Nutrition counseling and education about healthy options for easy and healthy coking/food options.  Barriers to learning/adherence to lifestyle change: lack of support  Demonstrated degree of understanding via: Teach Back  Monitoring/Evaluation: Dietary intake, exercise, meter and body weight in 4weeks for group(s)   Florinda Marker PharmD Candidate

## 2016-12-28 NOTE — Progress Notes (Signed)
Patient was seen in clinic with Florinda Marker, PharmD candidate. I agree with the assessment and plan of care documented.

## 2016-12-28 NOTE — Patient Instructions (Signed)
Patient educated about medication as defined in this encounter and verbalized understanding by repeating back instructions provided.   

## 2016-12-29 ENCOUNTER — Telehealth: Payer: Self-pay

## 2016-12-29 MED ORDER — BUPROPION HCL ER (SR) 150 MG PO TB12: 150.0000 mg | ORAL_TABLET | Freq: Two times a day (BID) | ORAL | 0 refills | Status: DC

## 2016-12-29 NOTE — Telephone Encounter (Addendum)
Due to episodes of hypoglycemia in the morning, the pt was informed to decrease dose from 30 units to 25 units at bedtime. The patient verbalized understanding.  The patient asked about using Levemir pens instead of Lantus because she was given 11 boxes. She was advised to finish up the supply of Lantus she has, but that if she runs out prior to her doctor's appointment on 01/09/17 she can use the same dose of Levemir as she was Lantus. Conversion is 1:1

## 2016-12-29 NOTE — Telephone Encounter (Signed)
Patient was contacted by Paige Cawley, PharmD candidate. I agree with the assessment and plan of care documented.  

## 2016-12-29 NOTE — Progress Notes (Signed)
reviewed

## 2017-01-08 ENCOUNTER — Telehealth: Payer: Self-pay | Admitting: Internal Medicine

## 2017-01-08 NOTE — Telephone Encounter (Signed)
APT. REMINDER CALL, LMTCB °

## 2017-01-09 ENCOUNTER — Encounter: Payer: Medicare HMO | Admitting: Internal Medicine

## 2017-01-12 ENCOUNTER — Encounter (HOSPITAL_COMMUNITY): Payer: Self-pay | Admitting: *Deleted

## 2017-01-12 ENCOUNTER — Ambulatory Visit (INDEPENDENT_AMBULATORY_CARE_PROVIDER_SITE_OTHER): Payer: Medicare HMO | Admitting: Internal Medicine

## 2017-01-12 ENCOUNTER — Emergency Department (HOSPITAL_COMMUNITY)
Admission: EM | Admit: 2017-01-12 | Discharge: 2017-01-14 | Disposition: A | Discharge status: Home / Self Care | Payer: Medicare HMO | Attending: Emergency Medicine | Admitting: Emergency Medicine

## 2017-01-12 ENCOUNTER — Encounter: Payer: Self-pay | Admitting: Internal Medicine

## 2017-01-12 DIAGNOSIS — R21 Rash and other nonspecific skin eruption: Secondary | ICD-10-CM | POA: Diagnosis not present

## 2017-01-12 DIAGNOSIS — E118 Type 2 diabetes mellitus with unspecified complications: Secondary | ICD-10-CM | POA: Diagnosis not present

## 2017-01-12 DIAGNOSIS — E1140 Type 2 diabetes mellitus with diabetic neuropathy, unspecified: Secondary | ICD-10-CM | POA: Insufficient documentation

## 2017-01-12 DIAGNOSIS — F17210 Nicotine dependence, cigarettes, uncomplicated: Secondary | ICD-10-CM | POA: Diagnosis not present

## 2017-01-12 DIAGNOSIS — Z79899 Other long term (current) drug therapy: Secondary | ICD-10-CM | POA: Insufficient documentation

## 2017-01-12 DIAGNOSIS — Z794 Long term (current) use of insulin: Secondary | ICD-10-CM | POA: Insufficient documentation

## 2017-01-12 DIAGNOSIS — I10 Essential (primary) hypertension: Secondary | ICD-10-CM | POA: Diagnosis not present

## 2017-01-12 DIAGNOSIS — J45909 Unspecified asthma, uncomplicated: Secondary | ICD-10-CM | POA: Diagnosis not present

## 2017-01-12 DIAGNOSIS — F309 Manic episode, unspecified: Secondary | ICD-10-CM | POA: Diagnosis not present

## 2017-01-12 DIAGNOSIS — F99 Mental disorder, not otherwise specified: Secondary | ICD-10-CM | POA: Diagnosis not present

## 2017-01-12 LAB — CBC WITH DIFFERENTIAL/PLATELET
BASOS PCT: 0 %
Basophils Absolute: 0 10*3/uL (ref 0.0–0.1)
EOS ABS: 0.1 10*3/uL (ref 0.0–0.7)
Eosinophils Relative: 1 %
HCT: 38.1 % (ref 36.0–46.0)
Hemoglobin: 12.8 g/dL (ref 12.0–15.0)
LYMPHS ABS: 4.2 10*3/uL — AB (ref 0.7–4.0)
Lymphocytes Relative: 41 %
MCH: 29.6 pg (ref 26.0–34.0)
MCHC: 33.6 g/dL (ref 30.0–36.0)
MCV: 88.2 fL (ref 78.0–100.0)
MONO ABS: 0.8 10*3/uL (ref 0.1–1.0)
MONOS PCT: 8 %
Neutro Abs: 5.1 10*3/uL (ref 1.7–7.7)
Neutrophils Relative %: 50 %
Platelets: 285 10*3/uL (ref 150–400)
RBC: 4.32 MIL/uL (ref 3.87–5.11)
RDW: 13 % (ref 11.5–15.5)
WBC: 10.2 10*3/uL (ref 4.0–10.5)

## 2017-01-12 LAB — COMPREHENSIVE METABOLIC PANEL
ALBUMIN: 4.8 g/dL (ref 3.5–5.0)
ALK PHOS: 88 U/L (ref 38–126)
ALT: 29 U/L (ref 14–54)
AST: 37 U/L (ref 15–41)
Anion gap: 13 (ref 5–15)
BILIRUBIN TOTAL: 0.7 mg/dL (ref 0.3–1.2)
BUN: 15 mg/dL (ref 6–20)
CALCIUM: 10.4 mg/dL — AB (ref 8.9–10.3)
CO2: 24 mmol/L (ref 22–32)
CREATININE: 1 mg/dL (ref 0.44–1.00)
Chloride: 98 mmol/L — ABNORMAL LOW (ref 101–111)
GFR calc Af Amer: >60 mL/min (ref >60)
GFR calc non Af Amer: 57 mL/min — ABNORMAL LOW (ref >60)
GLUCOSE: 123 mg/dL — AB (ref 65–99)
Potassium: 3.5 mmol/L (ref 3.5–5.1)
Sodium: 135 mmol/L (ref 135–145)
TOTAL PROTEIN: 7.7 g/dL (ref 6.5–8.1)

## 2017-01-12 LAB — URINALYSIS, ROUTINE W REFLEX MICROSCOPIC
BILIRUBIN URINE: NEGATIVE
Glucose, UA: NEGATIVE mg/dL
Hgb urine dipstick: NEGATIVE
KETONES UR: NEGATIVE mg/dL
Leukocytes, UA: NEGATIVE
Nitrite: NEGATIVE
PH: 5 (ref 5.0–8.0)
Protein, ur: 100 mg/dL — AB
Specific Gravity, Urine: 1.017 (ref 1.005–1.030)

## 2017-01-12 LAB — I-STAT CHEM 8, ED
BUN: 18 mg/dL (ref 6–20)
CALCIUM ION: 1.26 mmol/L (ref 1.15–1.40)
Chloride: 99 mmol/L — ABNORMAL LOW (ref 101–111)
Creatinine, Ser: 0.9 mg/dL (ref 0.44–1.00)
Glucose, Bld: 120 mg/dL — ABNORMAL HIGH (ref 65–99)
HCT: 40 % (ref 36.0–46.0)
Hemoglobin: 13.6 g/dL (ref 12.0–15.0)
Potassium: 3.5 mmol/L (ref 3.5–5.1)
SODIUM: 138 mmol/L (ref 135–145)
TCO2: 27 mmol/L (ref 0–100)

## 2017-01-12 LAB — TSH: TSH: 2.283 u[IU]/mL (ref 0.350–4.500)

## 2017-01-12 LAB — RAPID URINE DRUG SCREEN, HOSP PERFORMED
Amphetamines: NOT DETECTED
BARBITURATES: NOT DETECTED
Benzodiazepines: NOT DETECTED
COCAINE: NOT DETECTED
Opiates: NOT DETECTED
Tetrahydrocannabinol: NOT DETECTED

## 2017-01-12 LAB — ETHANOL: Alcohol, Ethyl (B): <5 mg/dL (ref <5)

## 2017-01-12 MED ORDER — HYDROXYZINE HCL 25 MG PO TABS
25.0000 mg | ORAL_TABLET | Freq: Every day | ORAL | Status: DC | PRN
Start: 2017-01-12 — End: 2017-01-14

## 2017-01-12 MED ORDER — AMLODIPINE BESYLATE 5 MG PO TABS
10.0000 mg | ORAL_TABLET | Freq: Every day | ORAL | Status: DC
  Administered 2017-01-13 – 2017-01-14 (×2): 10 mg via ORAL
  Filled 2017-01-12 (×2): qty 2

## 2017-01-12 MED ORDER — INSULIN GLARGINE 100 UNIT/ML ~~LOC~~ SOLN
30.0000 [IU] | Freq: Every day | SUBCUTANEOUS | Status: DC
  Administered 2017-01-13: 30 [IU] via SUBCUTANEOUS
  Filled 2017-01-12 (×2): qty 0.3

## 2017-01-12 MED ORDER — TRIAMCINOLONE ACETONIDE 0.1 % EX CREA: 1.0000 "application " | TOPICAL_CREAM | Freq: Two times a day (BID) | CUTANEOUS | 0 refills | Status: DC

## 2017-01-12 MED ORDER — GABAPENTIN 400 MG PO CAPS
800.0000 mg | ORAL_CAPSULE | Freq: Three times a day (TID) | ORAL | Status: DC
  Administered 2017-01-13 – 2017-01-14 (×4): 800 mg via ORAL
  Filled 2017-01-12 (×4): qty 2

## 2017-01-12 MED ORDER — METFORMIN HCL 500 MG PO TABS
1000.0000 mg | ORAL_TABLET | Freq: Two times a day (BID) | ORAL | Status: DC
  Administered 2017-01-13 – 2017-01-14 (×3): 1000 mg via ORAL
  Filled 2017-01-12 (×3): qty 2

## 2017-01-12 MED ORDER — METOPROLOL SUCCINATE ER 25 MG PO TB24
25.0000 mg | ORAL_TABLET | Freq: Every day | ORAL | Status: DC
  Administered 2017-01-13 – 2017-01-14 (×2): 25 mg via ORAL
  Filled 2017-01-12 (×3): qty 1

## 2017-01-12 MED ORDER — TRIAMCINOLONE ACETONIDE 0.1 % EX CREA
1.0000 "application " | TOPICAL_CREAM | Freq: Two times a day (BID) | CUTANEOUS | Status: DC
  Administered 2017-01-13 (×2): 1 via TOPICAL
  Filled 2017-01-12: qty 15

## 2017-01-12 MED ORDER — ATORVASTATIN CALCIUM 40 MG PO TABS
40.0000 mg | ORAL_TABLET | Freq: Every day | ORAL | Status: DC
  Administered 2017-01-13: 40 mg via ORAL
  Filled 2017-01-12 (×2): qty 1

## 2017-01-12 MED ORDER — LORAZEPAM 1 MG PO TABS
1.0000 mg | ORAL_TABLET | Freq: Three times a day (TID) | ORAL | Status: DC | PRN
  Administered 2017-01-14: 1 mg via ORAL
  Filled 2017-01-12: qty 1

## 2017-01-12 MED ORDER — HALOPERIDOL LACTATE 5 MG/ML IJ SOLN
INTRAMUSCULAR | Status: AC
  Filled 2017-01-12: qty 1

## 2017-01-12 MED ORDER — LORAZEPAM 2 MG/ML IJ SOLN
INTRAMUSCULAR | Status: AC
Start: 2017-01-12 — End: 2017-01-12
  Administered 2017-01-12: 2 mg
  Filled 2017-01-12: qty 1

## 2017-01-12 MED ORDER — PANTOPRAZOLE SODIUM 40 MG PO TBEC
40.0000 mg | DELAYED_RELEASE_TABLET | Freq: Every day | ORAL | Status: DC
  Administered 2017-01-13 – 2017-01-14 (×2): 40 mg via ORAL
  Filled 2017-01-12 (×2): qty 1

## 2017-01-12 MED ORDER — LISINOPRIL-HYDROCHLOROTHIAZIDE 20-12.5 MG PO TABS: 2.0000 | ORAL_TABLET | Freq: Every day | ORAL | Status: DC

## 2017-01-12 MED ORDER — BUPROPION HCL ER (SR) 150 MG PO TB12
150.0000 mg | ORAL_TABLET | Freq: Two times a day (BID) | ORAL | Status: DC
  Administered 2017-01-13 – 2017-01-14 (×3): 150 mg via ORAL
  Filled 2017-01-12 (×4): qty 1

## 2017-01-12 NOTE — ED Provider Notes (Signed)
[facility] DEPT Provider Note   CSN: [id number] Arrival date & time: 01/12/17  1637     History   Chief Complaint Chief Complaint  Patient presents with  . Generalized Body Aches  . Rash    HPI [name] is a 66 y.o. female.  Patient with chronic skin rash and sent for possible psych eval need from PCP as patient exhibited manic behavior. Patient denies SI/HI/AVH. Patient was given triamcinolone cream rx by PCP and awaiting follow up with dermatology. Patient frustrated with care.    The history is provided by the patient.  Rash   This is a chronic problem. The current episode started more than 1 week ago. The problem has not changed since onset.The problem is associated with nothing. There has been no fever. The fever has been present for less than 1 day. The rash is present on the face, torso and right arm. The pain is at a severity of 0/10. The patient is experiencing no pain. Associated symptoms include itching. Pertinent negatives include no blisters, no pain and no weeping. She has tried antihistamines for the symptoms. The treatment provided mild relief.    Past Medical History:  Diagnosis Date  . Allergy   . Anxiety   . Arthritis   . Asthma   . Depression   . Diabetes mellitus    type II  . Hyperlipidemia   . Hypertension   . Seizures [facility])    last seizure Jan 2016    Patient Active Problem List   Diagnosis Date Noted  . Lichen planus 35/57/3220  . Psychiatric illness 01/12/2017  . Diffuse pain 12/06/2016  . Superficial fungus infection of skin 11/01/2016  . Screening for osteoporosis 10/31/2016  . Screening for colon cancer 10/31/2016  . Hyperlipidemia 08/25/2016  . Obesity 06/20/2016  . Health care maintenance 05/17/2016  . Skin rash 05/17/2016  . Diabetic peripheral neuropathy associated with type 2 diabetes mellitus ([place]) 05/17/2016  . GERD (gastroesophageal reflux disease) 05/17/2016  . Tobacco use 05/17/2016  . Colon polyps 05/05/2016  .  Back pain of thoracolumbar region 11/08/2015  . DM type 2 (diabetes mellitus, type 2) ([place]) 05/03/2012  . Hypertension 05/03/2012  . Anxiety 05/03/2012    Past Surgical History:  Procedure Laterality Date  . APPENDECTOMY    . CYSTECTOMY    . FOOT SURGERY Bilateral 2001   x3  . SALPINGOOPHORECTOMY Left   . TUBAL LIGATION      OB History    Gravida Para Term Preterm AB Living   '4 2 2   2 2   ' SAB TAB Ectopic Multiple Live Births   2       2       Home Medications    Prior to Admission medications   Medication Sig Start Date End Date Taking? Authorizing Provider  Acetaminophen 500 MG coapsule  01/02/17   [provider]  amLODipine (NORVASC) 10 MG tablet Take 1 tablet (10 mg total) by mouth daily. 11/21/16   Shela Leff, MD  amLODipine (NORVASC) 5 MG tablet  10/12/16   [provider]  atorvastatin (LIPITOR) 40 MG tablet Take 1 tablet (40 mg total) by mouth daily. 11/21/16 11/21/17  Shela Leff, MD  Blood Glucose Monitoring Suppl (ACCU-CHEK AVIVA PLUS) w/Device KIT USE   TO CHECK GLUCOSE TWICE DAILY 10/11/16   Shela Leff, MD  buPROPion [facility] SR) 150 MG 12 hr tablet Take 1 tablet (150 mg total) by mouth 2 (two) times daily. 12/29/16  Forde Dandy, PharmD  clotrimazole (LOTRIMIN) 1 % cream Apply 1 application topically 2 (two) times daily. 10/31/16   Shela Leff, MD  gabapentin (NEURONTIN) 800 MG tablet Take 1 tablet (800 mg total) by mouth 3 (three) times daily. 11/21/16   Shela Leff, MD  glucose blood (ACCU-CHEK AVIVA PLUS) test strip Use to check blood sugar 3 times a day. Dx code E11.9. 11/21/16   Shela Leff, MD  hydrOXYzine (ATARAX/VISTARIL) 25 MG tablet Take 1 tablet (25 mg total) by mouth daily. For anxiety 12/06/16   Velna Ochs, MD  hydrOXYzine (ATARAX/VISTARIL) 25 MG tablet Take 1 tablet (25 mg total) by mouth daily as needed for anxiety. 12/15/16   Lucious Groves, DO  insulin glargine (LANTUS) 100 UNIT/ML  injection Inject 0.3 mLs (30 Units total) into the skin at bedtime. 11/21/16   Shela Leff, MD  Insulin Syringe-Needle U-100 31G X 15/64" 0.3 ML MISC Use to inject lantus insulin one time a day 11/21/16   Shela Leff, MD  Lancet Devices [facility]) lancets .Use to test blood glucose 3 times daily. Dx: E11.49 12/04/16   Shela Leff, MD  Lancets (ACCU-CHEK SOFT TOUCH) lancets Use to check blood sugar 3 times a day. Dx code E11.9. 11/21/16   Shela Leff, MD  lisinopril-hydrochlorothiazide (ZESTORETIC) 20-12.5 MG tablet Take 2 tablets by mouth daily. 11/21/16 11/21/17  Shela Leff, MD  metFORMIN (GLUCOPHAGE) 1000 MG tablet Take 1 tablet (1,000 mg total) by mouth 2 (two) times daily with a meal. 11/21/16   Shela Leff, MD  metoprolol succinate (TOPROL-XL) 25 MG 24 hr tablet  10/10/16   [provider]  neomycin-bacitracin-polymyxin (NEOSPORIN) [phone] ointment Apply topically 2 (two) times daily. 09/19/16   Shelly Bombard, MD  omeprazole (PRILOSEC) 40 MG capsule Take 1 capsule (40 mg total) by mouth daily. 11/21/16   Shela Leff, MD  triamcinolone cream (KENALOG) 0.1 % Apply 1 application topically 2 (two) times daily. Please apply to affected area two times daily. Please do not apply to the face. 01/12/17   Velna Ochs, MD    Family History Family History  Problem Relation Age of Onset  . Heart disease Mother   . Diabetes Mother   . Depression Mother   . Cancer Father   . Diabetes Father   . Depression Father   . Colon cancer Father   . Diabetes Sister   . Kidney disease Brother   . Diabetes Brother   . Kidney disease Brother   . Diabetes Brother   . Diabetes Brother   . Diabetes Brother   . Colon cancer Brother   . Esophageal cancer Neg Hx   . Rectal cancer Neg Hx   . Stomach cancer Neg Hx   . Pancreatic cancer Neg Hx     Social History Social History  Substance Use Topics  . Smoking status: Current Every Day Smoker     Packs/day: 0.50    Types: Cigarettes  . Smokeless tobacco: Never Used     Comment: DOWN TO 3 CIGARETTES DAY  . Alcohol use 0.0 oz/week     Comment: occassional glass of wine     Allergies   Aspirin; Other; and Chocolate   Review of Systems Review of Systems  Constitutional: Negative for chills and fever.  HENT: Negative for ear pain and sore throat.   Eyes: Negative for pain and visual disturbance.  Respiratory: Negative for cough and shortness of breath.   Cardiovascular: Negative for chest pain and palpitations.  Gastrointestinal:  Negative for abdominal pain and vomiting.  Genitourinary: Negative for dysuria and hematuria.  Musculoskeletal: Negative for arthralgias and back pain.  Skin: Positive for itching and rash. Negative for color change.  Neurological: Negative for seizures and syncope.  Psychiatric/Behavioral: Negative for behavioral problems, confusion, decreased concentration, hallucinations, sleep disturbance and suicidal ideas. The patient is not nervous/anxious.   All other systems reviewed and are negative.    Physical Exam Updated Vital Signs BP (!) 167/100   Pulse 89   Temp 97.9 F (36.6 C) (Oral)   Resp 19   SpO2 98%   Physical Exam  Constitutional: She is oriented to person, place, and time. She appears well-developed and well-nourished. No distress.  HENT:  Head: Normocephalic and atraumatic.  Eyes: Conjunctivae are normal. Pupils are equal, round, and reactive to light.  Neck: Neck supple.  Cardiovascular: Normal rate and regular rhythm.   No murmur heard. Pulmonary/Chest: Effort normal and breath sounds normal. No respiratory distress.  Abdominal: Soft. There is no tenderness.  Musculoskeletal: She exhibits no edema.  Neurological: She is alert and oriented to person, place, and time. She displays normal reflexes. No cranial nerve deficit or sensory deficit. She exhibits normal muscle tone. Coordination normal.  5+/5 strength, normal  sensation, no drift  Skin: Skin is warm and dry. Capillary refill takes less than 2 seconds. Rash noted.  Purple, polygonal papules across face, arms, neck , back, no involvement of mucosa, no signs of surrounding infection  Psychiatric: Her affect is labile. Her speech is rapid and/or pressured. She is hyperactive. Thought content is paranoid. Cognition and memory are normal. She expresses impulsivity. She expresses no suicidal ideation. She expresses no suicidal plans and no homicidal plans. She is inattentive.  Nursing note and vitals reviewed.    ED Treatments / Results  Labs (all labs ordered are listed, but only abnormal results are displayed) Labs Reviewed  COMPREHENSIVE METABOLIC PANEL - Abnormal; Notable for the following:       Result Value   Chloride 98 (*)    Glucose, Bld 123 (*)    Calcium 10.4 (*)    GFR calc non Af Amer 57 (*)    All other components within normal limits  CBC WITH DIFFERENTIAL/PLATELET - Abnormal; Notable for the following:    Lymphs Abs 4.2 (*)    All other components within normal limits  URINALYSIS, ROUTINE W REFLEX MICROSCOPIC - Abnormal; Notable for the following:    APPearance HAZY (*)    Protein, ur 100 (*)    Bacteria, UA RARE (*)    Squamous Epithelial / LPF 0-5 (*)    All other components within normal limits  I-STAT CHEM 8, ED - Abnormal; Notable for the following:    Chloride 99 (*)    Glucose, Bld 120 (*)    All other components within normal limits  ETHANOL  RAPID URINE DRUG SCREEN, HOSP PERFORMED  TSH    EKG  EKG Interpretation None       Radiology No results found.  Procedures Procedures (including critical care time)  Medications Ordered in ED Medications  LORazepam (ATIVAN) tablet 1 mg (not administered)  amLODipine (NORVASC) tablet 10 mg (not administered)  atorvastatin (LIPITOR) tablet 40 mg (not administered)  buPROPion (WELLBUTRIN SR) 12 hr tablet 150 mg (not administered)  gabapentin (NEURONTIN) capsule 800  mg (not administered)  hydrOXYzine (ATARAX/VISTARIL) tablet 25 mg (not administered)  insulin glargine (LANTUS) injection 30 Units (not administered)  metFORMIN (GLUCOPHAGE) tablet 1,000 mg (not administered)  metoprolol succinate (TOPROL-XL) 24 hr tablet 25 mg (not administered)  pantoprazole (PROTONIX) EC tablet 40 mg (not administered)  triamcinolone cream (KENALOG) 0.1 % 1 application (not administered)  lisinopril (PRINIVIL,ZESTRIL) tablet 40 mg (not administered)    And  hydrochlorothiazide (HYDRODIURIL) tablet 25 mg (not administered)  LORazepam (ATIVAN) 2 MG/ML injection (2 mg  Given 01/12/17 2059)     Initial Impression / Assessment and Plan / ED Course  I have reviewed the triage vital signs and the nursing notes.  Pertinent labs & imaging results that were available during my care of the patient were reviewed by me and considered in my medical decision making (see chart for details).     [name] is a 66 year old female with history of depression, diabetes, hypertension, high cholesterol, anxiety who presents to the ED with need for mental health evaluation from primary care office.  Patient's vitals at time of arrival to the ED are unremarkable and patient is without fever.  Patient seen by primary care provider for chronic rash and given prescription for triamcinolone until patient can follow-up with dermatology.  Patient according to primary care provider's note was exhibiting signs of acute mania and paranoia.  On examination patient with multiple purple, polygonal papules across face, chest, arms that appear chronic in nature.  Rash does not involve the oral mucosa and no concern for Stevens-Johnson syndrome, TEN, EM.  Patient throughout encounter with multiple signs of paranoia and mania.  Patient is unable to clearly answer questions and has bizarre speech including paranoia about being poisoned by neighbors.  Patient had labs ordered for medical clearance and tele psych was  ordered.  Patient with IVC placed and given Haldol and Ativan for sedation and for safety for herself and staff.  Patient had normal urine drug screen.  Normal alcohol level.  Normal TSH.  No signs of urinary tract infection.  Patient had unremarkable CMP, CBC.  Patient with IVC filled out and pending.  Patient signed out to oncoming ED staff with patient pending final psychological disposition.  I discussed this patient w/ Dr. Reather Converse who supervised the care of this patient.  Final Clinical Impressions(s) / ED Diagnoses   Final diagnoses:  [facility])  Rash    New Prescriptions New Prescriptions   No medications on file     Lennice Sites, DO 01/13/17 4695    Elnora Morrison, MD 01/13/17 [phone]

## 2017-01-12 NOTE — Patient Instructions (Signed)
Ms Schendel,  I have sent a prescription for a steroid cream called triamcinolone to your pharmacy. Please apply this to your rash twice daily. Please do not put this on your face. I would like for you to return to clinic next week on Wednesday or Thursday. We will get in touch with your dermatologist first thing Monday morning. If you have any questions or concerns, call our clinic at [phone] or after hours call [phone] and ask for the internal medicine resident on call. Thank you!  - Dr. Philipp Ovens

## 2017-01-12 NOTE — ED Notes (Signed)
Pt is trying to leave the ED. Pt states "you are doing nothing for me here and I was EMS to take me to [facility]." Pt is out in the hallway arguing with the MD about leaving. Pt is stating " you tell me who the president is" and "do you have a cure for aids. Security is present.

## 2017-01-12 NOTE — Assessment & Plan Note (Addendum)
Patient is here for rash. Patient has had this rash for many months and was evaluated by [facility] in November of 2017. On exam she has multiple purple, polygonal plaques with excoriation and some central ulceration concerning for possible lichen planus vs. discoid lupus. Patient had a biopsy performed with dermatology, we are in the process of obtaining these records. Plan was to treat empirically with triamcinolone cream today and obtain records first thing Monday morning. However patient is exhibiting behavior concerning for acute psychiatric illness that is interfering with care (see psychiatric illness A&P). Patient stated that she was going to the ER. Recommended trial of triamcinolone cream (avoid lesions on the face). Please avoid systemic steroids in the setting of her uncontrolled diabetes and likely acute psychiatric illness. Consider checking lupus labs.  -- Obtain dermatology records  -- F/u early next week

## 2017-01-12 NOTE — BHH Counselor (Signed)
Counselor attempted to complete assessment with Patient.  Patient was able to wake up, however was unable to stay awake for longer than 7 seconds.  Patient's sitter, Alphia Kava., and Myriam Jacobson, RN attempted to wake Patient up.  Patient continued to fall back asleep. Lindon Romp, NP, recommended assessment notification from staff and completion when Patient woke up.  Patient's Nurse, Myriam Jacobson was informed of recommendation.    Leroy Sea, LPC-A Therapeutic Triage Specialist.

## 2017-01-12 NOTE — ED Notes (Signed)
Pt's is back in her bed.  Pt was willing to let this RN give her the shot of Ativan.  Pt keeps stating " you are an idiot, you all are idiots.  I didn't even go to medical school, I am not even registered to vote". Pt wants her bag but this RN has not gone through and checked the bag for contraband.

## 2017-01-12 NOTE — Assessment & Plan Note (Deleted)
Patient is here today with complaint of rash. She was previously evaluated by dermatology for the same complaint in November 2017.

## 2017-01-12 NOTE — ED Notes (Signed)
Pt just "vomited" on the floor. Pt's vomit just looked like spit.  Pt also had emesis bag in hand when she decided to spit on the ground.

## 2017-01-12 NOTE — Assessment & Plan Note (Addendum)
Patient is exhibiting behavior concerning for an acute psychiatric illness and paranoid dellusions. I have personally seen and examined [name] at a previous clinic visit and this behavior is new. Today, she is speaking very rapidly and her speech is very difficult to understand. She called into clinic stating that she had "sliver fish" all over her body and needed to be seen. On arrival to clinic she handed [place] a cool whip container and stated that she had been collecting whatever was on her body. The container was carefully opened with gloves. The container had multiple scrunched up dirty paper towels, but was other empty (no evidence of bugs or lice). Today she is exhibiting paranoid behavior and making statements about her neighbors who are HIV positive and attempting to poison or infect her trough her air vents. She is here today with concern of rash that has previously been evaluated by dermatology. The rash is likely lichen planus or possibly discoid lupus, we are in the process of obtaining biopsy reports from [facility]. However, today she believes that this rash is an infection that she caught from her neighbors - either through her air vents or through direct contact. I attempted to reassure patient on multiple occasions that this is unlikely an infectious etiology, but to no avail. Her speech is rapid and difficult to understand. She is making statements that her house is unsafe. She stated on multiple occasions that the "authorities" were involved. My plan was to treat empirically with triamcinolone cream and obtain dermatology records first thing Monday morning, however patient was very upset with this plan. She demanded that we do something about her neighbors and unsafe living situation. I informed her that if the police were involved there was nothing else we could do. She angrily threw away the triamcinolone prescription and left the clinic stating she would go to the ER  if we couldn't help her. If patient does present to the emergency department - I would recommend psychiatric evaluation for possible mania or paranoid delusions.

## 2017-01-12 NOTE — ED Triage Notes (Addendum)
To ED via pov for eval of rash. States she is in need of her visteral. Very difficult to assess. She has a flight of ideas. Easily distracted and changes subject, makes no sense. Appears manic. Did say she was [facility]. Appears in nad

## 2017-01-12 NOTE — Progress Notes (Signed)
   CC: Rash  HPI:  Ms.Giavanni Silvey is a 66 y.o. female with past medical history outlined below here for a rash. For the details of today's visit, please refer to the assessment and plan.  Past Medical History:  Diagnosis Date  . Allergy   . Anxiety   . Arthritis   . Asthma   . Depression   . Diabetes mellitus    type II  . Hyperlipidemia   . Hypertension   . Seizures ([place])    last seizure Jan 2016    Review of Systems:  All pertinents listed in HPI, otherwise negative  Physical Exam:  Vitals:   01/12/17 1544  Weight: 183 lb 9.6 oz (83.3 kg)    Constitutional: NAD, appears comfortable Cardiovascular: RRR, no murmurs, rubs, or gallops.  Pulmonary/Chest: CTAB, no wheezes, rales, or rhonchi.  Abdominal: Soft, non tender, non distended. +BS.  Extremities: Warm and well perfused.No edema.  Neurological: A&Ox3, CN II - XII grossly intact.  Skin: Multiple purple, polygonal papules across her right flank, right chest, right arm, bilateral face and neck Psychiatric: Rapid speech, paranoia   Assessment & Plan:   See Encounters Tab for problem based charting.  Patient seen with Dr. Daryll Drown

## 2017-01-13 LAB — CBG MONITORING, ED
GLUCOSE-CAPILLARY: 107 mg/dL — AB (ref 65–99)
Glucose-Capillary: 93 mg/dL (ref 65–99)
Glucose-Capillary: 104 mg/dL — ABNORMAL HIGH (ref 65–99)

## 2017-01-13 MED ORDER — HYDROCHLOROTHIAZIDE 25 MG PO TABS
25.0000 mg | ORAL_TABLET | Freq: Every day | ORAL | Status: DC
  Administered 2017-01-13 – 2017-01-14 (×2): 25 mg via ORAL
  Filled 2017-01-13 (×2): qty 1

## 2017-01-13 MED ORDER — LISINOPRIL 20 MG PO TABS
40.0000 mg | ORAL_TABLET | Freq: Every day | ORAL | Status: DC
  Administered 2017-01-13 – 2017-01-14 (×2): 40 mg via ORAL
  Filled 2017-01-13 (×2): qty 2

## 2017-01-13 MED ORDER — NICOTINE 21 MG/24HR TD PT24
21.0000 mg | MEDICATED_PATCH | Freq: Once | TRANSDERMAL | Status: DC
  Administered 2017-01-13: 21 mg via TRANSDERMAL
  Filled 2017-01-13: qty 1

## 2017-01-13 NOTE — ED Notes (Signed)
Lunch tray provided for pt

## 2017-01-13 NOTE — ED Notes (Signed)
2 large jagged end can tops removed from pt's scrub pocket.  Security called to wand pt again.  Charge nurse Westley Chandler, RN made aware.

## 2017-01-13 NOTE — ED Notes (Signed)
This RN was unable to find pt's served IVC paperwork.  Confirmed with magistrate that paperwork was never received.  Re-sent paperwork and magistrate requested it be more current than a signed date of 01/12/17 2050.  Will notify Dr Reather Converse on his arrival at 1500.

## 2017-01-13 NOTE — ED Notes (Signed)
Patient CBG was 107. 

## 2017-01-13 NOTE — Progress Notes (Signed)
Pt has been assessed and inpatient gero-psych placement is recommended by Jefferson Fuel, NP.  CSW faxed referrals to the following:  [facility]  [place],  [facility],  [place],  [place],  [place]   CSW will continue to seek placement.  Areatha Keas. Judi Cong, MSW, [place] Work Disposition [phone]

## 2017-01-13 NOTE — BH Assessment (Signed)
Tele Assessment Note   Patient is a 66 year old female that reports that she is being held against her will.  Patient reports that she is being kidnapped but she was not able to tell me where she lives or who she lives with.  Patient is a poor historian.  Patient appears manic with flight of ideas.   Patient was see on Jan 12, 2017 at the [facility].  Patient was not able to answer most the questions patient did deny SI/HI/Psychosis/Substance Abuse.   Per documentation in the epic chart, Pt is trying to leave the ED. Pt states "you are doing nothing for me here and I was EMS to take me to [facility]." Pt is out in the hallway arguing with the MD about leaving. Pt is stating "you tell me who the president is" and "do you have a cure for aids.  Pt keeps stating "you are an idiot, you all are idiots.  I didn't even go to medical school, I am not even registered to vote".  Patient received a shot of Ativan, last night.   Diagnosis: Psychosis, NOS  Past Medical History:  Past Medical History:  Diagnosis Date  . Allergy   . Anxiety   . Arthritis   . Asthma   . Depression   . Diabetes mellitus    type II  . Hyperlipidemia   . Hypertension   . Seizures ([place])    last seizure Jan 2016    Past Surgical History:  Procedure Laterality Date  . APPENDECTOMY    . CYSTECTOMY    . FOOT SURGERY Bilateral 2001   x3  . SALPINGOOPHORECTOMY Left   . TUBAL LIGATION      Family History:  Family History  Problem Relation Age of Onset  . Heart disease Mother   . Diabetes Mother   . Depression Mother   . Cancer Father   . Diabetes Father   . Depression Father   . Colon cancer Father   . Diabetes Sister   . Kidney disease Brother   . Diabetes Brother   . Kidney disease Brother   . Diabetes Brother   . Diabetes Brother   . Diabetes Brother   . Colon cancer Brother   . Esophageal cancer Neg Hx   . Rectal cancer Neg Hx   . Stomach cancer Neg Hx   . Pancreatic cancer Neg Hx      Social History:  reports that she has been smoking Cigarettes.  She has been smoking about 0.50 packs per day. She has never used smokeless tobacco. She reports that she drinks alcohol. She reports that she does not use drugs.  Additional Social History:  Alcohol / Drug Use History of alcohol / drug use?: No history of alcohol / drug abuse  CIWA: CIWA-Ar BP: 119/76 Pulse Rate: 84 COWS:    PATIENT STRENGTHS: (choose at least two) Average or above average intelligence Physical Health  Allergies:  Allergies  Allergen Reactions  . Aspirin Anaphylaxis    Swelling in mouth, throat and hives  . Other Itching and Swelling    Antibiotic (name unknown).  Occurred three years ago with prescription antibiotic.  Marland Kitchen Chocolate Hives and Rash    Home Medications:  (Not in a hospital admission)  OB/GYN Status:  No LMP recorded. Patient is postmenopausal.  General Assessment Data Location of Assessment: [facility] ED TTS Assessment: In system Is this a Tele or Face-to-Face Assessment?: Tele Assessment Is this an Initial Assessment or  a Re-assessment for this encounter?: Initial Assessment Marital status: Single Maiden name: NA Is patient pregnant?: No Pregnancy Status: No Living Arrangements:  (Pt states that she does not know) Can pt return to current living arrangement?: Yes Admission Status: Involuntary Is patient capable of signing voluntary admission?: No Referral Source: Self/Family/Friend Insurance type: [facility] Medicaid  Medical Screening Exam ([place]) Medical Exam completed:  (NA)  Crisis Care Plan Living Arrangements:  (Pt states that she does not know) Legal Guardian:  (NA) Name of Psychiatrist:  (Patient refused to answer the question. ) Name of Therapist:  (Patient refused to answer the question)  Education Status Is patient currently in school?: No Current Grade: NA Highest grade of school patient has completed: NA Name of school: NA Contact person:  NA  Risk to self with the past 6 months Suicidal Ideation: No Has patient been a risk to self within the past 6 months prior to admission? : No Suicidal Intent: No Has patient had any suicidal intent within the past 6 months prior to admission? : No Is patient at risk for suicide?: No Suicidal Plan?: No Has patient had any suicidal plan within the past 6 months prior to admission? : No Access to Means: No What has been your use of drugs/alcohol within the last 12 months?: NA Previous Attempts/Gestures: No How many times?: 0 Other Self Harm Risks: NA Triggers for Past Attempts:  (Patient refused to answer the question. ) Intentional Self Injurious Behavior: None Family Suicide History: No Recent stressful life event(s):  (Patient refused to answer the question. ) Persecutory voices/beliefs?: No Depression: Yes Depression Symptoms: Insomnia, Despondent, Tearfulness, Isolating, Fatigue, Guilt, Loss of interest in usual pleasures, Feeling worthless/self pity, Feeling angry/irritable Substance abuse history and/or treatment for substance abuse?: No Suicide prevention information given to non-admitted patients: Not applicable  Risk to Others within the past 6 months Homicidal Ideation: No Does patient have any lifetime risk of violence toward others beyond the six months prior to admission? : No Thoughts of Harm to Others: No Current Homicidal Intent: No Current Homicidal Plan: No Access to Homicidal Means: No Identified Victim: NA History of harm to others?: No Assessment of Violence: None Noted Violent Behavior Description: NA Does patient have access to weapons?: No Criminal Charges Pending?: No Does patient have a court date: No Is patient on probation?: No  Psychosis Hallucinations: None noted Delusions: Grandiose (Reports that she has been kidnapped. )  Mental Status Report Appearance/Hygiene: In scrubs Eye Contact: Unable to Assess Motor Activity: Unable to  assess Speech: Pressured, Loud, Tangential Level of Consciousness: Restless, Irritable Mood: Depressed, Anxious, Suspicious, Despair, Irritable Affect: Anxious, Blunted, Depressed, Irritable Anxiety Level: Minimal Thought Processes: Flight of Ideas, Thought Blocking, Tangential Judgement: Impaired Orientation: Person, Place, Time, Situation Obsessive Compulsive Thoughts/Behaviors: None  Cognitive Functioning Concentration: Decreased Memory: Recent Impaired, Remote Impaired IQ: Average Insight: Poor Impulse Control: Poor Appetite: Poor Weight Loss: 0 Weight Gain: 0 Sleep: Unable to Assess Total Hours of Sleep:  (UTA) Vegetative Symptoms: Unable to Assess  ADLScreening [facility] Assessment Services) Patient's cognitive ability adequate to safely complete daily activities?: Yes Patient able to express need for assistance with ADLs?: Yes Independently performs ADLs?: Yes (appropriate for developmental age)  Prior Inpatient Therapy Prior Inpatient Therapy:  (Patient refused to answer the question. ) Prior Therapy Dates:  (Patient refused to answer the question. ) Prior Therapy Facilty/Provider(s):  (Patient refused to answer the question. ) Reason for Treatment:  (Patient refused to answer the question. )  Prior Outpatient Therapy Prior Outpatient Therapy:  (Patient refused to answer the question. ) Prior Therapy Dates:  (Patient refused to answer the question. ) Prior Therapy Facilty/Provider(s):  (Patient refused to answer the question. ) Reason for Treatment:  (Patient refused to answer the question. ) Does patient have an ACCT team?: No Does patient have Intensive In-House Services?  : No Does patient have [facility] services? : No Does patient have P4CC services?: No  ADL Screening (condition at time of admission) Patient's cognitive ability adequate to safely complete daily activities?: Yes Is the patient deaf or have difficulty hearing?: No Does the patient have difficulty  seeing, even when wearing glasses/contacts?: No Does the patient have difficulty concentrating, remembering, or making decisions?: Yes Patient able to express need for assistance with ADLs?: Yes Does the patient have difficulty dressing or bathing?: No Independently performs ADLs?: Yes (appropriate for developmental age) Does the patient have difficulty walking or climbing stairs?: No Weakness of Legs: None Weakness of Arms/Hands: None  Home Assistive Devices/Equipment Home Assistive Devices/Equipment: None    Abuse/Neglect Assessment (Assessment to be complete while patient is alone) Physical Abuse: Denies Verbal Abuse: Denies Sexual Abuse: Denies Exploitation of patient/patient's resources: Denies Self-Neglect: Denies Values / Beliefs Cultural Requests During Hospitalization: None Spiritual Requests During Hospitalization: None Consults Spiritual Care Consult Needed: No Social Work Consult Needed: No Regulatory affairs officer (For Healthcare) Does Patient Have a Medical Advance Directive?: No Copy of Living Will in Chart?: No - copy requested Would patient like information on creating a medical advance directive?: No - Patient declined    Additional Information 1:1 In Past 12 Months?: No CIRT Risk: No Elopement Risk: Yes Does patient have medical clearance?: Yes     Disposition: Per Farris Has, NP - patient meets criteria for inpatient [facility].  CSW will seek placement.  Disposition Initial Assessment Completed for this Encounter: Yes Disposition of Patient: Inpatient treatment program Type of inpatient treatment program: Adult [facility], per Farris Has, NP )  Graciella Freer [place] 01/13/2017 10:16 AM

## 2017-01-13 NOTE — ED Notes (Signed)
TTS recommends the pt be seen tomorrow by the NP.  Pt was unable to answer questions tonight and kept falling asleep.

## 2017-01-13 NOTE — ED Notes (Addendum)
CBG 93.   Pt eating dinner at this time.

## 2017-01-13 NOTE — ED Notes (Addendum)
Pt transported to showers.

## 2017-01-13 NOTE — ED Notes (Signed)
Pt returned from showers.

## 2017-01-13 NOTE — ED Notes (Signed)
Lunch order placed

## 2017-01-14 DIAGNOSIS — F200 Paranoid schizophrenia: Secondary | ICD-10-CM | POA: Diagnosis not present

## 2017-01-14 DIAGNOSIS — F329 Major depressive disorder, single episode, unspecified: Secondary | ICD-10-CM | POA: Diagnosis not present

## 2017-01-14 DIAGNOSIS — Z794 Long term (current) use of insulin: Secondary | ICD-10-CM | POA: Diagnosis not present

## 2017-01-14 DIAGNOSIS — I10 Essential (primary) hypertension: Secondary | ICD-10-CM | POA: Diagnosis not present

## 2017-01-14 DIAGNOSIS — M1990 Unspecified osteoarthritis, unspecified site: Secondary | ICD-10-CM | POA: Diagnosis not present

## 2017-01-14 DIAGNOSIS — Z886 Allergy status to analgesic agent status: Secondary | ICD-10-CM | POA: Diagnosis not present

## 2017-01-14 DIAGNOSIS — E785 Hyperlipidemia, unspecified: Secondary | ICD-10-CM | POA: Diagnosis not present

## 2017-01-14 DIAGNOSIS — F17210 Nicotine dependence, cigarettes, uncomplicated: Secondary | ICD-10-CM | POA: Diagnosis not present

## 2017-01-14 DIAGNOSIS — R21 Rash and other nonspecific skin eruption: Secondary | ICD-10-CM | POA: Diagnosis not present

## 2017-01-14 DIAGNOSIS — J45909 Unspecified asthma, uncomplicated: Secondary | ICD-10-CM | POA: Diagnosis not present

## 2017-01-14 DIAGNOSIS — E1140 Type 2 diabetes mellitus with diabetic neuropathy, unspecified: Secondary | ICD-10-CM | POA: Diagnosis not present

## 2017-01-14 DIAGNOSIS — E119 Type 2 diabetes mellitus without complications: Secondary | ICD-10-CM | POA: Diagnosis not present

## 2017-01-14 DIAGNOSIS — Z79899 Other long term (current) drug therapy: Secondary | ICD-10-CM | POA: Diagnosis not present

## 2017-01-14 DIAGNOSIS — F309 Manic episode, unspecified: Secondary | ICD-10-CM | POA: Diagnosis not present

## 2017-01-14 LAB — CBG MONITORING, ED: Glucose-Capillary: 77 mg/dL (ref 65–99)

## 2017-01-14 NOTE — Progress Notes (Addendum)
Spoke with Freida Busman, RN. He reports the pt has been accepted to [facility] after 07:00 and report will be given to AM shift.  Lind Covert, MSW, [place] TTS Specialist [phone]

## 2017-01-14 NOTE — ED Notes (Addendum)
Contacted by Caryl Ada with [facility]. They have accepted the pt for inpatient therapy, and she may go anytime after 0700 today. Receiving physician is Dr. Geanie Kenning. No room assignment at this time, but will be given when report is called prior to departure. Call report to [phone].   They have asked that we fax a copy of the pt's IVC paper to [phone].

## 2017-01-14 NOTE — ED Notes (Signed)
PT transported via  [employer] to [facility]

## 2017-01-14 NOTE — ED Notes (Signed)
Ordered regular breakfast tray for pt.

## 2017-01-14 NOTE — Progress Notes (Signed)
Internal Medicine Clinic Attending  I saw and evaluated the patient.  I personally confirmed the key portions of the history and exam documented by Dr. Philipp Ovens and I reviewed pertinent patient test results.  The assessment, diagnosis, and plan were formulated together and I agree with the documentation in the resident's note.  I interviewed [name] with Dr. Philipp Ovens and also saw her upon leaving the clinic.  Agree with Dr. Rivka Safer assessment.  [name] expressed the belief that her rash was acutely infectious and somehow transmitted to her by her neighbors through her air vents.  She had rapid speech, tangential.  She did not answer direct questions about the rash, but became upset when we didn't direct our questioning to the issue with her neighbors.  It is unclear if she is safe at home or if she has the police involved in a dispute at this time.  She reported that she was going to the ED, and our clinic would recommend psychiatry evaluation.  He rash is chronic and more information is needed to appropriately treat.

## 2017-01-14 NOTE — ED Notes (Signed)
Called [place] [employer] and left voicemail with Sgt. Paschal regarding need for transportation of an IVC'd pt to [facility] after 0800 this morning. Left [phone] as call back numbers.

## 2017-01-15 ENCOUNTER — Ambulatory Visit: Payer: Medicare HMO | Admitting: Obstetrics

## 2017-01-24 ENCOUNTER — Telehealth: Payer: Self-pay | Admitting: Dietician

## 2017-01-24 DIAGNOSIS — H2513 Age-related nuclear cataract, bilateral: Secondary | ICD-10-CM | POA: Diagnosis not present

## 2017-01-24 NOTE — Telephone Encounter (Signed)
LMOM for appt with [name] for her 60 minute Class on 01/25/2017.

## 2017-01-25 ENCOUNTER — Ambulatory Visit: Payer: Medicare HMO | Admitting: Dietician

## 2017-01-30 ENCOUNTER — Ambulatory Visit (INDEPENDENT_AMBULATORY_CARE_PROVIDER_SITE_OTHER): Payer: Medicare HMO | Admitting: Dietician

## 2017-01-30 ENCOUNTER — Ambulatory Visit (INDEPENDENT_AMBULATORY_CARE_PROVIDER_SITE_OTHER): Payer: Medicare HMO | Admitting: Internal Medicine

## 2017-01-30 ENCOUNTER — Encounter: Payer: Self-pay | Admitting: Internal Medicine

## 2017-01-30 VITALS — BP 137/77 | HR 97 | Temp 99.1°F | Ht 66.5 in | Wt 184.8 lb

## 2017-01-30 DIAGNOSIS — Z23 Encounter for immunization: Secondary | ICD-10-CM

## 2017-01-30 DIAGNOSIS — F17210 Nicotine dependence, cigarettes, uncomplicated: Secondary | ICD-10-CM

## 2017-01-30 DIAGNOSIS — E11649 Type 2 diabetes mellitus with hypoglycemia without coma: Secondary | ICD-10-CM

## 2017-01-30 DIAGNOSIS — Z794 Long term (current) use of insulin: Principal | ICD-10-CM

## 2017-01-30 DIAGNOSIS — F419 Anxiety disorder, unspecified: Secondary | ICD-10-CM

## 2017-01-30 DIAGNOSIS — Z720 Tobacco use: Secondary | ICD-10-CM

## 2017-01-30 DIAGNOSIS — Z713 Dietary counseling and surveillance: Secondary | ICD-10-CM | POA: Diagnosis not present

## 2017-01-30 DIAGNOSIS — Z79899 Other long term (current) drug therapy: Secondary | ICD-10-CM | POA: Diagnosis not present

## 2017-01-30 DIAGNOSIS — I10 Essential (primary) hypertension: Secondary | ICD-10-CM | POA: Diagnosis not present

## 2017-01-30 DIAGNOSIS — E119 Type 2 diabetes mellitus without complications: Secondary | ICD-10-CM

## 2017-01-30 DIAGNOSIS — Z0000 Encounter for general adult medical examination without abnormal findings: Secondary | ICD-10-CM | POA: Diagnosis not present

## 2017-01-30 DIAGNOSIS — Z1159 Encounter for screening for other viral diseases: Secondary | ICD-10-CM | POA: Diagnosis not present

## 2017-01-30 LAB — GLUCOSE, CAPILLARY: Glucose-Capillary: 91 mg/dL (ref 65–99)

## 2017-01-30 LAB — POCT GLYCOSYLATED HEMOGLOBIN (HGB A1C): Hemoglobin A1C: 7.2

## 2017-01-30 MED ORDER — INSULIN DETEMIR 100 UNIT/ML FLEXPEN: 25.0000 [IU] | PEN_INJECTOR | Freq: Every day | SUBCUTANEOUS | 3 refills | Status: DC

## 2017-01-30 NOTE — Progress Notes (Signed)
Diabetes Self-Management Education  Visit Type: First/Initial  Appt. Start Time: 1500 Appt. End Time: 1517  01/30/2017  Ms. Trudi Ida, identified by name and date of birth, is a 66 y.o. female with a diagnosis of Diabetes: Type 2.   ASSESSMENT Weight is decreasing gradually and A1C is improved.  Assisted patient with completing wellness paperwork from her insurer. There were no vitals taken for this visit. There is no height or weight on file to calculate BMI.      Diabetes Self-Management Education - 01/30/17 1600      Visit Information   Visit Type First/Initial     Initial Visit   Diabetes Type Type 2   Are you currently following a meal plan? Yes   What type of meal plan do you follow? smaller portions   Are you taking your medications as prescribed? Yes   Date Diagnosed --  2002     Health Coping   How would you rate your overall health? Good     Psychosocial Assessment   Patient Belief/Attitude about Diabetes Motivated to manage diabetes   Self-care barriers Lack of transportation;Lack of material resources   Self-management support Doctor's office;Family;CDE visits   Patient Concerns Nutrition/Meal planning;Monitoring   Special Needs Simplified materials   Preferred Learning Style No preference indicated   Learning Readiness Ready     Pre-Education Assessment   Patient understands monitoring blood glucose, interpreting and using results Needs Review     Complications   Last HgB A1C per patient/outside source 7.2 %   How often do you check your blood sugar? 3-4 times/day   Fasting Blood glucose range (mg/dL) 70-129;130-179   Postprandial Blood glucose range (mg/dL) 130-179;180-200;>200   Number of hypoglycemic episodes per month 0   Have you had a dilated eye exam in the past 12 months? Yes   Have you had a dental exam in the past 12 months? Yes   Are you checking your feet? Yes   How many days per week are you checking your feet? 7     Exercise   Exercise Type ADL's;Light (walking / raking leaves)     Patient Education   Monitoring Taught/evaluated SMBG meter.     Individualized Goals (developed by patient)   Monitoring  Other (comment)  bring meter to office visits     Outcomes   Expected Outcomes Demonstrated interest in learning. Expect positive outcomes   Future DMSE Yearly   Program Status Completed      Individualized Plan for Diabetes Self-Management Training:   Learning Objective:  Patient will have a greater understanding of diabetes self-management. Patient education plan is to attend individual and/or group sessions per assessed needs and concerns.  m  Plan:   Patient Instructions   My plan to support myself in continuing these changes to care for my diabetes is to attend or contact:   Weight Management Diabetes group meetings on the Third Thursday of each month  Exercise ? [employer] -[phone]- [email or web address] ? [employer]-[phone]- [email or web address] [facility] or [place] [employer]  Attend Doctor and Diabetes Educator, Dietitian, pharmacist appointments  See on June 21st at 10:15 AM for group     Expected Outcomes:  Demonstrated interest in learning. Expect positive outcomes  Education material provided: meter and instructions  If problems or questions, patient to contact team via:  Phone  Future DSME appointment: Sidney Ace, [name], [place] 01/30/2017 4:14 PM.

## 2017-01-30 NOTE — Patient Instructions (Addendum)
My plan to support myself in continuing these changes to care for my diabetes is to attend or contact:   Weight Management Diabetes group meetings on the Third Thursday of each month  Exercise ? [employer] -[phone]- [email or web address] ? 24 Hour [employer]itness-[phone]- [email or web address] [facility] or [place] onTV  Attend Doctor and Diabetes Educator, Dietitian, pharmacist appointments  See on June 21st at 10:15 AM for group

## 2017-01-30 NOTE — Patient Instructions (Signed)
[name] it was nice seeing you today.  Continue taking Amlodipine and Lisinopril-Hydrochlorothiazide as before for your blood pressure.  Take Levemir 25 units daily at bedtime for diabetes. Continue taking metformin as before.   I have given you a referral for a bone density scan.

## 2017-01-31 LAB — HEPATITIS C ANTIBODY: Hep C Virus Ab: 0.2 s/co ratio (ref 0.0–0.9)

## 2017-01-31 NOTE — Assessment & Plan Note (Signed)
BP Readings from Last 3 Encounters:  01/30/17 137/77  01/14/17 107/60  01/12/17 (!) 147/87    Lab Results  Component Value Date   NA 138 01/12/2017   K 3.5 01/12/2017   CREATININE 0.90 01/12/2017    Assessment: Blood pressure control:  well controlled  Comments: Currently on Amlodipine 10 mg daily and Lisinopril-HCTZ 40-25 mg daily.   Plan: Medications:  continue current medications Educational resources provided:   Educated patient about healthy eating and exercise. Emphasized the importance of weight loss.

## 2017-01-31 NOTE — Assessment & Plan Note (Addendum)
Lab Results  Component Value Date   HGBA1C 7.2 01/30/2017   HGBA1C 11.1 10/31/2016   HGBA1C 12.5 08/22/2016     Assessment: Diabetes control:   A1c slightly above goal  Comments: Current regimen includes Lantus 30 U QHS and Metformin 1000 mg BID. Patient reports taking Lantus 25 U QHS instead. Her A1c is much improved from prior visit. She is requesting Lantus to be switched to Levemir as she has samples at home that don't expire until 07/2017. Patient brought an old meter with her to this visit which has readings from 2008. States she has an eye exam scheduled in 04/2017 with Dr. Adela Ports.  Plan: Medications: Levemir 25 U QHS. Continue Metformin as above. Prescription for a new meter.  Home glucose monitoring: Frequency:  TID Timing:  before meals  Instruction/counseling given: reminded to get eye exam, reminded to bring blood glucose meter & log to each visit, reminded to bring medications to each visit, discussed foot care, discussed the need for weight loss, discussed diet and discussed sick day management

## 2017-01-31 NOTE — Assessment & Plan Note (Signed)
A Patient continues to use Hydroxyzine prn anxiety and is requesting a refill. I explained to her that Hydroxyzine is not the preferred drug for anxiety. GAD-7 score zero at this visit.  P -D/c Hydroxyzine  -Repeat GAD-7 at next visit; patient may need an SSRI in the future

## 2017-01-31 NOTE — Assessment & Plan Note (Signed)
Hep C antibody negative. PCV-13 at this visit. Referral for DEXA scan.

## 2017-01-31 NOTE — Progress Notes (Signed)
   CC: Pt is here for a regular checkup. HTN, DM, tobacco use, and anxiety were discussed during this visit.   HPI:  [name] is a 66 y.o. F with a PMHx of conditions listed below presenting to the clinic for a regular checkup. HTN, DM, tobacco use, and anxiety were discussed during this visit. Please see problem based charting for the status of the patient's current and chronic medical conditions.   Past Medical History:  Diagnosis Date  . Allergy   . Anxiety   . Arthritis   . Asthma   . Depression   . Diabetes mellitus    type II  . Hyperlipidemia   . Hypertension   . Seizures ([place])    last seizure Jan 2016    Review of Systems: Pertinent positives mentioned in HPI. Remainder of all ROS negative.   Physical Exam:  Vitals:   01/30/17 1437 01/30/17 1653  BP: 134/69 137/77  Pulse: (!) 107 97  Temp: 99.1 F (37.3 C)   TempSrc: Oral   SpO2: 100%   Weight: 184 lb 12.8 oz (83.8 kg)   Height: 5' 6.5" (1.689 m)    Physical Exam  Constitutional: She is oriented to person, place, and time. She appears well-developed and well-nourished. No distress.  HENT:  Head: Normocephalic and atraumatic.  Eyes: Right eye exhibits no discharge. Left eye exhibits no discharge.  Neck: Neck supple. No tracheal deviation present.  Cardiovascular: Normal rate, regular rhythm and intact distal pulses.   Pulmonary/Chest: Effort normal and breath sounds normal. No respiratory distress. She has no wheezes. She has no rales.  Abdominal: Soft. Bowel sounds are normal. She exhibits no distension. There is no tenderness.  Musculoskeletal: She exhibits no edema.  Neurological: She is alert and oriented to person, place, and time.  Skin: Skin is warm and dry.    Assessment & Plan:   See Encounters Tab for problem based charting.  Patient discussed with Dr. Eppie Gibson

## 2017-01-31 NOTE — Assessment & Plan Note (Signed)
A Patient has previously failed nicotine patches and Chantix. She was prescribed Bupropion during a prior visit but is not taking this medication and continues to smoke upto 1PPD. States Bupropion gives her a "skin burning" sensation. I counseled her again on smoking cessation and patient is not interested in quitting.  P -D/c Bupropion -Continue to counsel at future visits

## 2017-02-06 DIAGNOSIS — M25539 Pain in unspecified wrist: Secondary | ICD-10-CM | POA: Diagnosis not present

## 2017-02-06 DIAGNOSIS — G5603 Carpal tunnel syndrome, bilateral upper limbs: Secondary | ICD-10-CM | POA: Diagnosis not present

## 2017-02-06 DIAGNOSIS — G5602 Carpal tunnel syndrome, left upper limb: Secondary | ICD-10-CM | POA: Diagnosis not present

## 2017-02-06 DIAGNOSIS — G5601 Carpal tunnel syndrome, right upper limb: Secondary | ICD-10-CM | POA: Diagnosis not present

## 2017-02-06 NOTE — Progress Notes (Signed)
Case discussed with Dr. Rathore at the time of the visit.  We reviewed the resident's history and exam and pertinent patient test results.  I agree with the assessment, diagnosis and plan of care documented in the resident's note. 

## 2017-02-07 ENCOUNTER — Other Ambulatory Visit: Payer: Self-pay

## 2017-02-07 NOTE — Telephone Encounter (Signed)
omeprazole (PRILOSEC) 40 MG capsule, Refill request @ [facility].

## 2017-02-07 NOTE — Telephone Encounter (Signed)
Call made to [facility] has a refill(90-day supply) on file.  [employer] allowed me to place refill on pt's behalf. Pt received rx in 5-7 business days. Will contact patient and make her aware.  Phone call complete.Regenia Skeeter, Darlene Cassady5/30/201810:18 AM

## 2017-02-13 ENCOUNTER — Other Ambulatory Visit: Payer: Self-pay

## 2017-02-13 NOTE — Telephone Encounter (Signed)
Lancets (ACCU-CHEK SOFT TOUCH) lancets,    glucose blood (ACCU-CHEK AVIVA PLUS) test strip, refill request @ [facility].

## 2017-02-14 MED ORDER — OMEPRAZOLE 40 MG PO CPDR: 40.0000 mg | DELAYED_RELEASE_CAPSULE | Freq: Every day | ORAL | 0 refills | Status: DC

## 2017-02-14 MED ORDER — GLUCOSE BLOOD VI STRP: ORAL_STRIP | 3 refills | Status: DC

## 2017-02-14 MED ORDER — ACCU-CHEK SOFT TOUCH LANCETS MISC: 3 refills | Status: DC

## 2017-02-19 ENCOUNTER — Encounter: Payer: Self-pay | Admitting: Podiatry

## 2017-02-19 ENCOUNTER — Ambulatory Visit (INDEPENDENT_AMBULATORY_CARE_PROVIDER_SITE_OTHER): Payer: Medicare HMO | Admitting: Podiatry

## 2017-02-19 DIAGNOSIS — M79674 Pain in right toe(s): Secondary | ICD-10-CM | POA: Diagnosis not present

## 2017-02-19 DIAGNOSIS — E1140 Type 2 diabetes mellitus with diabetic neuropathy, unspecified: Secondary | ICD-10-CM | POA: Diagnosis not present

## 2017-02-19 DIAGNOSIS — B351 Tinea unguium: Secondary | ICD-10-CM | POA: Diagnosis not present

## 2017-02-19 DIAGNOSIS — M79675 Pain in left toe(s): Secondary | ICD-10-CM | POA: Diagnosis not present

## 2017-02-19 DIAGNOSIS — Q828 Other specified congenital malformations of skin: Secondary | ICD-10-CM | POA: Diagnosis not present

## 2017-02-20 NOTE — Progress Notes (Signed)
Subjective: 66 y.o. returns the office today for painful, elongated, thickened toenails which she cannot trim herself. Denies any redness or drainage around the nails. She also has calluses to both of her feet but denies any redness, drainage from the callus sites. Denies any acute changes since last appointment and no new complaints today. Denies any systemic complaints such as fevers, chills, nausea, vomiting.   Objective: AAO 3, NAD DP/PT pulses palpable, CRT less than 3 seconds Protective sensation decreased with Simms Weinstein monofilament, Achilles tendon reflex intact.  Nails hypertrophic, dystrophic, elongated, brittle, discolored 10. There is tenderness overlying the nails 1-5 bilaterally. There is no surrounding erythema or drainage along the nail sites. Hyperkeratotic lesions to bilateral plantar/posterior heel and right submetatarsal 3. No underlying ulceration, drainage, or signs of infection No open lesions or pre-ulcerative lesions are identified. No other areas of tenderness bilateral lower extremities. No overlying edema, erythema, increased warmth. No pain with calf compression, swelling, warmth, erythema.  Assessment: Patient presents with symptomatic onychomycosis, hyperkeratotic lesions  Plan: -Treatment options including alternatives, risks, complications were discussed -Nails sharply debrided 10 without complication/bleeding. -Hyperkeratotic lesion sharply debrided 3 without complications or bleeding. -Discussed daily foot inspection. If there are any changes, to call the office immediately.  -Follow-up in 3 months or sooner if any problems are to arise. In the meantime, encouraged to call the office with any questions, concerns, changes symptoms.  Celesta Gentile, DPM

## 2017-02-21 ENCOUNTER — Other Ambulatory Visit: Payer: Self-pay

## 2017-02-21 ENCOUNTER — Encounter: Payer: Self-pay | Admitting: Gastroenterology

## 2017-02-21 DIAGNOSIS — E11649 Type 2 diabetes mellitus with hypoglycemia without coma: Secondary | ICD-10-CM

## 2017-02-21 DIAGNOSIS — Z794 Long term (current) use of insulin: Principal | ICD-10-CM

## 2017-02-21 MED ORDER — ACCU-CHEK SOFT TOUCH LANCETS MISC: 3 refills | Status: DC

## 2017-02-21 MED ORDER — "INSULIN SYRINGE-NEEDLE U-100 31G X 15/64"" 0.3 ML MISC": 11 refills | Status: DC

## 2017-02-21 MED ORDER — ACCU-CHEK SOFTCLIX LANCET DEV MISC: 3 refills | Status: DC

## 2017-02-21 NOTE — Telephone Encounter (Signed)
  Insulin Syringe-Needle U-100 31G X 15/64" 0.3 ML MISC,    glucose blood (ACCU-CHEK AVIVA PLUS) test strip,  Lancets (ACCU-CHEK SOFT TOUCH) lancets,   Refill request @ [facility].

## 2017-02-26 ENCOUNTER — Other Ambulatory Visit: Payer: Self-pay | Admitting: Internal Medicine

## 2017-02-26 NOTE — Telephone Encounter (Signed)
NEEDS TEST STRIPS, INSULIN, [facility] [phone]

## 2017-03-01 ENCOUNTER — Ambulatory Visit: Payer: Medicare HMO | Admitting: Dietician

## 2017-03-05 ENCOUNTER — Other Ambulatory Visit: Payer: Self-pay

## 2017-03-05 DIAGNOSIS — E11649 Type 2 diabetes mellitus with hypoglycemia without coma: Secondary | ICD-10-CM

## 2017-03-05 DIAGNOSIS — Z794 Long term (current) use of insulin: Principal | ICD-10-CM

## 2017-03-05 NOTE — Telephone Encounter (Signed)
Lm for rtc 

## 2017-03-05 NOTE — Telephone Encounter (Signed)
Requesting to speak with a nurse about insulin. Please call back.  

## 2017-03-08 MED ORDER — ACCU-CHEK FASTCLIX LANCETS MISC: 12 refills | Status: DC

## 2017-03-08 MED ORDER — PEN NEEDLES 31G X 5 MM MISC: 1.0000 | Freq: Every day | 4 refills | Status: DC

## 2017-03-08 MED ORDER — GLUCOSE BLOOD VI STRP: ORAL_STRIP | 12 refills | Status: DC

## 2017-03-08 NOTE — Telephone Encounter (Signed)
Pt asking about recently rxs for test supplies, insulin. informed rxs have already been sent to [facility] per Dr Marlowe Sax. [employer] stated Levemir cost $ 268.00. Also insulin supplies were returned. Called pt - stated [employer] had sent wrong supplies; even thoughAccu-chek supplies rx were sent by Dr Marlowe Sax. Then pt stated she only need pen needles - which per EPIC, should had been sent with refills. Requesting rx for pen needles be sent to [facility] on [facility].

## 2017-03-08 NOTE — Telephone Encounter (Signed)
Asking to speak with a nurse. Please call pt back.

## 2017-03-08 NOTE — Telephone Encounter (Signed)
Called [name]- she was somewhat difficult to understand, but from what I gathered I gave her an accu chek guide meter and she Needs Aviva chek guide strips, fastclick lancets from [facility]   she needs pen needles from [facility] because she needs them right away. Says she has 47 Insulin pens, but also said she is throwing a lot of them away because they don't work. [facility] pharmacy tech say she is in Stage 2 - so her insulin is 268$ on her next fill when she needs more insulin, she will not be able to afford this. She may need assistacne from  send to Dr. Maudie Mercury for assistance with obtaining affordable insulin.  I asked her to not throw her insulin pens away f but to put them in a different container and bring all her insulin and pen needles to the diabetes class

## 2017-03-15 ENCOUNTER — Telehealth: Payer: Self-pay

## 2017-03-15 NOTE — Telephone Encounter (Signed)
Patient called reports insurance [facility]) needs prior auth on Levemir, lancets, test strips and pen needles.

## 2017-03-16 ENCOUNTER — Other Ambulatory Visit: Payer: Self-pay | Admitting: *Deleted

## 2017-03-16 DIAGNOSIS — Z794 Long term (current) use of insulin: Principal | ICD-10-CM

## 2017-03-16 DIAGNOSIS — E11649 Type 2 diabetes mellitus with hypoglycemia without coma: Secondary | ICD-10-CM

## 2017-03-19 DIAGNOSIS — G5601 Carpal tunnel syndrome, right upper limb: Secondary | ICD-10-CM | POA: Diagnosis not present

## 2017-03-19 DIAGNOSIS — G5602 Carpal tunnel syndrome, left upper limb: Secondary | ICD-10-CM | POA: Diagnosis not present

## 2017-03-19 DIAGNOSIS — G5603 Carpal tunnel syndrome, bilateral upper limbs: Secondary | ICD-10-CM | POA: Diagnosis not present

## 2017-03-20 ENCOUNTER — Ambulatory Visit (INDEPENDENT_AMBULATORY_CARE_PROVIDER_SITE_OTHER): Payer: Medicare HMO | Admitting: Internal Medicine

## 2017-03-20 ENCOUNTER — Encounter: Payer: Self-pay | Admitting: Internal Medicine

## 2017-03-20 VITALS — BP 121/71 | HR 92 | Temp 98.7°F | Wt 188.8 lb

## 2017-03-20 DIAGNOSIS — F17210 Nicotine dependence, cigarettes, uncomplicated: Secondary | ICD-10-CM

## 2017-03-20 DIAGNOSIS — F99 Mental disorder, not otherwise specified: Secondary | ICD-10-CM

## 2017-03-20 DIAGNOSIS — Z794 Long term (current) use of insulin: Secondary | ICD-10-CM

## 2017-03-20 DIAGNOSIS — E11649 Type 2 diabetes mellitus with hypoglycemia without coma: Secondary | ICD-10-CM | POA: Diagnosis not present

## 2017-03-20 DIAGNOSIS — Z818 Family history of other mental and behavioral disorders: Secondary | ICD-10-CM | POA: Diagnosis not present

## 2017-03-20 DIAGNOSIS — Z833 Family history of diabetes mellitus: Secondary | ICD-10-CM

## 2017-03-20 DIAGNOSIS — F22 Delusional disorders: Secondary | ICD-10-CM | POA: Diagnosis not present

## 2017-03-20 MED ORDER — GLUCOSE BLOOD VI STRP: ORAL_STRIP | 12 refills | Status: DC

## 2017-03-20 MED ORDER — ACCU-CHEK SOFTCLIX LANCET DEV MISC: 3 refills | Status: DC

## 2017-03-20 NOTE — Progress Notes (Signed)
   CC: Patient is here for a follow-up of her diabetes.  HPI:  [name] is a 66 y.o. female with a past medical history of conditions listed below presenting to the clinic for a follow-up of her diabetes. Her psychiatrist illness was also discussed during this visit. Please see problem based charting for the status of the patient's current and chronic medical conditions.   Past Medical History:  Diagnosis Date  . Allergy   . Anxiety   . Arthritis   . Asthma   . Depression   . Diabetes mellitus    type II  . Hyperlipidemia   . Hypertension   . Seizures ([place])    last seizure Jan 2016   Review of Systems: Pertinent positives mentioned in HPI. Remainder of all ROS negative.   Physical Exam:  Vitals:   03/20/17 1317  BP: 121/71  Pulse: 92  Temp: 98.7 F (37.1 C)  TempSrc: Oral  Weight: 188 lb 12.8 oz (85.6 kg)   Physical Exam  Constitutional: She is oriented to person, place, and time. She appears well-developed and well-nourished. No distress.  HENT:  Head: Normocephalic and atraumatic.  Eyes: Right eye exhibits no discharge. Left eye exhibits no discharge.  Cardiovascular: Normal rate, regular rhythm and intact distal pulses.   Pulmonary/Chest: Effort normal and breath sounds normal. No respiratory distress. She has no wheezes. She has no rales.  Abdominal: Soft. Bowel sounds are normal. She exhibits no distension. There is no tenderness.  Neurological: She is alert and oriented to person, place, and time.  Skin: Skin is warm and dry.  Psychiatric:  Tangential, pressured speech Exhibiting paranoid delusions    Assessment & Plan:   See Encounters Tab for problem based charting.  Patient seen with Dr. Dareen Piano

## 2017-03-20 NOTE — Assessment & Plan Note (Signed)
Lab Results  Component Value Date   HGBA1C 7.2 01/30/2017   HGBA1C 11.1 10/31/2016   HGBA1C 12.5 08/22/2016     Assessment: Diabetes control:  slightly above goal  Comments: Current medication regimen includes Levemir 25 units at bedtime and metformin 1000 mg twice daily. She reports compliance with her medications. Denies having any hypoglycemic symptoms. States she has not checked her sugar for the past 1 month as she does not have testing supplies. I explained to her that her testing supplies were refill last month. Patient exhibited paranoid delusions and her speech was tangential and pressured. This made it challenging to discuss her diabetes during this visit. Donna also met with her today.   Plan: Medications:  continue current medications Home glucose monitoring: Frequency:  3 times a day Timing:  before meals Other plans:  -Return to the clinic in 2 weeks with her meter   

## 2017-03-20 NOTE — Progress Notes (Signed)
Internal Medicine Clinic Attending  I saw and evaluated the patient.  I personally confirmed the key portions of the history and exam documented by Dr. Rathore and I reviewed pertinent patient test results.  The assessment, diagnosis, and plan were formulated together and I agree with the documentation in the resident's note.  

## 2017-03-20 NOTE — Patient Instructions (Signed)
Continue taking Levemir and metformin as before  Return to the clinic in 2 weeks with your meter.

## 2017-03-20 NOTE — Assessment & Plan Note (Addendum)
Assessment Patient exhibited paranoid delusions at this visit. Her speech was tangential and pressured. It was difficult to understand her thought process. She mentioned the [place] having diseases and deformities. Also was repeatedly talking about drug trafficking. Stated she enters her house from the back door because "government equipment are stolen." Patient asked me "are you gay?" and "do you read the bible?" She denied having any suicidal or homicidal ideations. When asked whether she has seen a psychiatrist before she initially denied that then later accepted. Patient seemed upset and stated she did not want to see a psychiatrist. Patient was seen here in the clinic and in the ED in 01/2017 for manic behaviors, bizarre speech, flight of ideas, and paranoid delusions. She was admitted to inpatient psych at that time for paranoid schizophrenia. Patient denies taking any psychiatric medications at present.  Plan - Patient did not display active evidence of danger to self or others. She was advised to return to the clinic in 2 weeks. Please discuss psychiatry referral again at that visit.

## 2017-03-21 ENCOUNTER — Other Ambulatory Visit: Payer: Self-pay

## 2017-03-21 DIAGNOSIS — Z794 Long term (current) use of insulin: Principal | ICD-10-CM

## 2017-03-21 DIAGNOSIS — E11649 Type 2 diabetes mellitus with hypoglycemia without coma: Secondary | ICD-10-CM

## 2017-03-21 NOTE — Telephone Encounter (Signed)
Needs to speak with a nurse about meds. Please call back.  

## 2017-03-22 NOTE — Telephone Encounter (Signed)
Pt is calling back to speak with a nurse about meds. Please call back.

## 2017-03-22 NOTE — Telephone Encounter (Signed)
Rxs were sent to [facility] instead of [facility] . Please re-send rxs to [facility] per pt's request.

## 2017-03-23 MED ORDER — ACCU-CHEK SOFTCLIX LANCET DEV MISC: 3 refills | Status: AC

## 2017-03-23 MED ORDER — GLUCOSE BLOOD VI STRP: ORAL_STRIP | 12 refills | Status: AC

## 2017-03-29 ENCOUNTER — Ambulatory Visit: Payer: Medicare HMO | Admitting: Pharmacist

## 2017-04-03 ENCOUNTER — Ambulatory Visit: Payer: Medicare HMO

## 2017-04-06 ENCOUNTER — Telehealth: Payer: Self-pay | Admitting: Dietician

## 2017-04-06 NOTE — Telephone Encounter (Signed)
[name] left voicemails about missing the diabetes group in July and scheduled an appointment for September diabetes group. She also asked for help getting test strips for her new Accu chek Guide meter. Called [employer] on her behalf, they were holding the new strips because they were not able to verify with her that she had changed meters. When they took them off hold and tried to put through the new strips, they were denied because they have record that they sent her strips for her Accu chek Aviva meter on 03/08/17 for a 90 day supply for 3 x/day testing and cannot send her any more until 05/07/17. [name] says she does not have strips or the old meter.  I encouraged her to call [employer] and discuss this with them.  She was often difficult to  understand during our conversation and her cell phone- which is her only phone now- was breaking up and dropping her call.

## 2017-04-06 NOTE — Telephone Encounter (Signed)
[name] called back again her speech is difficult to understand, but I think she said "she doesn't go out anywhere and always sees her doctors" and then "that is all she wanted to say, good-bye".

## 2017-04-10 ENCOUNTER — Telehealth: Payer: Self-pay | Admitting: Internal Medicine

## 2017-04-10 NOTE — Telephone Encounter (Signed)
Debh. States pt needed an appt, one was made

## 2017-04-11 ENCOUNTER — Encounter: Payer: Self-pay | Admitting: *Deleted

## 2017-04-18 DIAGNOSIS — G5602 Carpal tunnel syndrome, left upper limb: Secondary | ICD-10-CM | POA: Diagnosis not present

## 2017-04-25 ENCOUNTER — Encounter: Payer: Medicare HMO | Admitting: Gastroenterology

## 2017-04-26 ENCOUNTER — Ambulatory Visit (AMBULATORY_SURGERY_CENTER): Payer: Self-pay

## 2017-04-26 VITALS — Ht 66.5 in | Wt 178.2 lb

## 2017-04-26 DIAGNOSIS — Z86010 Personal history of colonic polyps: Secondary | ICD-10-CM

## 2017-04-26 MED ORDER — NA SULFATE-K SULFATE-MG SULF 17.5-3.13-1.6 GM/177ML PO SOLN: 1.0000 | Freq: Once | ORAL | 0 refills | Status: AC

## 2017-04-26 NOTE — Progress Notes (Signed)
Denies allergies to eggs or soy products. Denies complication of anesthesia or sedation. Denies use of weight loss medication. Denies use of O2.   Emmi instructions declined.   Patient  was very difficult to understand. Her sentences were not connecting or had anything to do with what I was asking her or what she was telling me. I am not sure that she totally understood her prep instructions even though I spent a lot of time trying to explain and get the correct information.

## 2017-04-30 ENCOUNTER — Encounter: Payer: Self-pay | Admitting: Gastroenterology

## 2017-05-03 DIAGNOSIS — G5602 Carpal tunnel syndrome, left upper limb: Secondary | ICD-10-CM | POA: Diagnosis not present

## 2017-05-07 ENCOUNTER — Ambulatory Visit (AMBULATORY_SURGERY_CENTER): Payer: Medicare HMO | Admitting: Gastroenterology

## 2017-05-07 ENCOUNTER — Telehealth: Payer: Self-pay | Admitting: Gastroenterology

## 2017-05-07 ENCOUNTER — Encounter: Payer: Self-pay | Admitting: Gastroenterology

## 2017-05-07 VITALS — BP 151/65 | HR 77 | Temp 96.9°F | Resp 17 | Ht 66.0 in | Wt 178.0 lb

## 2017-05-07 DIAGNOSIS — Z1211 Encounter for screening for malignant neoplasm of colon: Secondary | ICD-10-CM

## 2017-05-07 DIAGNOSIS — Z800 Family history of malignant neoplasm of digestive organs: Secondary | ICD-10-CM

## 2017-05-07 DIAGNOSIS — E119 Type 2 diabetes mellitus without complications: Secondary | ICD-10-CM | POA: Diagnosis not present

## 2017-05-07 DIAGNOSIS — Z1212 Encounter for screening for malignant neoplasm of rectum: Secondary | ICD-10-CM

## 2017-05-07 DIAGNOSIS — Z86010 Personal history of colonic polyps: Secondary | ICD-10-CM | POA: Diagnosis not present

## 2017-05-07 DIAGNOSIS — I10 Essential (primary) hypertension: Secondary | ICD-10-CM | POA: Diagnosis not present

## 2017-05-07 MED ORDER — SODIUM CHLORIDE 0.9 % IV SOLN: 500.0000 mL | INTRAVENOUS | Status: AC

## 2017-05-07 NOTE — Patient Instructions (Addendum)
YOU HAD AN ENDOSCOPIC PROCEDURE TODAY AT [facility]:   Refer to the procedure report that was given to you for any specific questions about what was found during the examination.  If the procedure report does not answer your questions, please call your gastroenterologist to clarify.  If you requested that your care partner not be given the details of your procedure findings, then the procedure report has been included in a sealed envelope for you to review at your convenience later.  YOU SHOULD EXPECT: Some feelings of bloating in the abdomen. Passage of more gas than usual.  Walking can help get rid of the air that was put into your GI tract during the procedure and reduce the bloating. If you had a lower endoscopy (such as a colonoscopy or flexible sigmoidoscopy) you may notice spotting of blood in your stool or on the toilet paper. If you underwent a bowel prep for your procedure, you may not have a normal bowel movement for a few days.  Please Note:  You might notice some irritation and congestion in your nose or some drainage.  This is from the oxygen used during your procedure.  There is no need for concern and it should clear up in a day or so.  SYMPTOMS TO REPORT IMMEDIATELY:   Following lower endoscopy (colonoscopy or flexible sigmoidoscopy):  Excessive amounts of blood in the stool  Significant tenderness or worsening of abdominal pains  Swelling of the abdomen that is new, acute  Fever of 100F or higher    For urgent or emergent issues, a gastroenterologist can be reached at any hour by calling [phone].   DIET:  We do recommend a small meal at first, but then you may proceed to your regular diet.  Drink plenty of fluids but you should avoid alcoholic beverages for 24 hours.  ACTIVITY:  You should plan to take it easy for the rest of today and you should NOT DRIVE or use heavy machinery until tomorrow (because of the sedation medicines used during the test).     FOLLOW UP: Our staff will call the number listed on your records the next business day following your procedure to check on you and address any questions or concerns that you may have regarding the information given to you following your procedure. If we do not reach you, we will leave a message.  However, if you are feeling well and you are not experiencing any problems, there is no need to return our call.  We will assume that you have returned to your regular daily activities without incident.  If any biopsies were taken you will be contacted by phone or by letter within the next 1-3 weeks.  Please call us at [phone] if you have not heard about the biopsies in 3 weeks.    SIGNATURES/CONFIDENTIALITY: You and/or your care partner have signed paperwork which will be entered into your electronic medical record.  These signatures attest to the fact that that the information above on your After Visit Summary has been reviewed and is understood.  Full responsibility of the confidentiality of this discharge information lies with you and/or your care-partner.   Handout was given to your care partner on diverticulosis. Your blood sugar was 112 in the recovery room. You may resume your current medications today. Repeat colonoscopy in 5 years for screening purposes. Please call if any questions or concerns.
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
## 2017-05-07 NOTE — Op Note (Signed)
[facility] Patient Name: [name] Procedure Date: 05/07/2017 3:57 PM MRN: [id number] Endoscopist: Mallie Mussel L. Loletha Carrow , MD Age: 66 Referring MD:  Date of Birth: 03/08/1951 Gender: Female Account #: [id number] Procedure:                Colonoscopy Indications:              Colon cancer screening in patient at increased                            risk: Colorectal cancer in father and brother, Last                            colonoscopy: August 2017 (poor preparation with                            Miralax) Medicines:                Monitored Anesthesia Care Procedure:                Pre-Anesthesia Assessment:                           - Prior to the procedure, a History and Physical                            was performed, and patient medications and                            allergies were reviewed. The patient's tolerance of                            previous anesthesia was also reviewed. The risks                            and benefits of the procedure and the sedation                            options and risks were discussed with the patient.                            All questions were answered, and informed consent                            was obtained. Prior Anticoagulants: The patient has                            taken no previous anticoagulant or antiplatelet                            agents. ASA Grade Assessment: III - A patient with                            severe systemic disease. After reviewing the risks  and benefits, the patient was deemed in                            satisfactory condition to undergo the procedure.                           After obtaining informed consent, the colonoscope                            was passed under direct vision. Throughout the                            procedure, the patient's blood pressure, pulse, and                            oxygen saturations were monitored continuously. The                             Model PCF-H190DL [id number]) scope was introduced                            through the anus and advanced to the the cecum,                            identified by appendiceal orifice and ileocecal                            valve. The colonoscopy was performed without                            difficulty. The patient tolerated the procedure                            well. The quality of the bowel preparation was                            good. The ileocecal valve, appendiceal orifice, and                            rectum were photographed. The quality of the bowel                            preparation was evaluated using the BBPS [facility]                            Bowel Preparation Scale) with scores of: Right                            Colon = 2, Transverse Colon = 2 and Left Colon = 2.                            The total BBPS score equals 6. The bowel  preparation used was SUPREP. Scope In: 4:04:54 PM Scope Out: 4:19:50 PM Scope Withdrawal Time: 0 hours 9 minutes 32 seconds  Total Procedure Duration: 0 hours 14 minutes 56 seconds  Findings:                 The perianal and digital rectal examinations were                            normal.                           Diverticula were found in the entire colon.                           The exam was otherwise without abnormality on                            direct and retroflexion views. Complications:            No immediate complications. Estimated Blood Loss:     Estimated blood loss: none. Impression:               - Diverticulosis in the entire examined colon.                           - The examination was otherwise normal on direct                            and retroflexion views.                           - No specimens collected. Recommendation:           - Patient has a contact number available for                            emergencies. The signs and symptoms of  potential                            delayed complications were discussed with the                            patient. Return to normal activities tomorrow.                            Written discharge instructions were provided to the                            patient.                           - Resume previous diet.                           - Continue present medications.                           - Repeat colonoscopy in 5 years for screening  purposes. Shriyan Arakawa L. Loletha Carrow, MD 05/07/2017 4:26:21 PM This report has been signed electronically.

## 2017-05-07 NOTE — Progress Notes (Signed)
To recovery, report to RN, VSS. 

## 2017-05-07 NOTE — Progress Notes (Signed)
Pt's states no medical or surgical changes since previsit or office visit. 

## 2017-05-08 ENCOUNTER — Encounter: Payer: Medicare HMO | Admitting: Internal Medicine

## 2017-05-08 ENCOUNTER — Telehealth: Payer: Self-pay

## 2017-05-08 ENCOUNTER — Encounter: Payer: Self-pay | Admitting: Internal Medicine

## 2017-05-08 DIAGNOSIS — K5790 Diverticulosis of intestine, part unspecified, without perforation or abscess without bleeding: Secondary | ICD-10-CM | POA: Insufficient documentation

## 2017-05-08 NOTE — Telephone Encounter (Signed)
  Follow up Call-  Call back number 05/07/2017 04/19/2016  Post procedure Call Back phone  # [phone] hm  Permission to leave phone message Yes Yes  Some recent data might be hidden     Patient questions:  Do you have a fever, pain , or abdominal swelling? No. Pain Score  0 *  Have you tolerated food without any problems? Yes.    Have you been able to return to your normal activities? Yes.    Do you have any questions about your discharge instructions: Diet   No. Medications  No. Follow up visit  No.  Do you have questions or concerns about your Care? No.  Actions: * If pain score is 4 or above: No action needed, pain <4.

## 2017-05-08 NOTE — Telephone Encounter (Signed)
  Follow up Call-  Call back number 05/07/2017 04/19/2016  Post procedure Call Back phone  # [phone] hm  Permission to leave phone message Yes Yes  Some recent data might be hidden     Left message

## 2017-05-08 NOTE — Progress Notes (Signed)
Charlett Nose Riling INAPPROPRIATELY changed the PCP from Dr Marlowe Sax to me. I am not Ms Wehrly's PCP. I am forwarding results to Dr Marlowe Sax.

## 2017-05-11 ENCOUNTER — Telehealth: Payer: Self-pay | Admitting: Dietician

## 2017-05-11 NOTE — Telephone Encounter (Signed)
[name] called to tell us that she (had two surgeries this month) is not feeling well, doesn't want to come to diabetes group meeting.

## 2017-05-15 ENCOUNTER — Encounter: Payer: Medicare HMO | Admitting: Internal Medicine

## 2017-05-22 ENCOUNTER — Encounter: Payer: Medicare HMO | Admitting: Internal Medicine

## 2017-05-24 ENCOUNTER — Ambulatory Visit: Payer: Medicare HMO | Admitting: Podiatry

## 2017-06-15 ENCOUNTER — Ambulatory Visit: Payer: Medicare HMO | Admitting: Podiatry

## 2017-06-19 ENCOUNTER — Encounter (HOSPITAL_COMMUNITY): Payer: Self-pay

## 2017-06-19 ENCOUNTER — Observation Stay (HOSPITAL_COMMUNITY)
Admission: EM | Admit: 2017-06-19 | Discharge: 2017-06-19 | Discharge status: Against Medical Advice | Payer: Medicare HMO | Attending: Oncology | Admitting: Oncology

## 2017-06-19 DIAGNOSIS — Z79899 Other long term (current) drug therapy: Secondary | ICD-10-CM | POA: Insufficient documentation

## 2017-06-19 DIAGNOSIS — E1142 Type 2 diabetes mellitus with diabetic polyneuropathy: Secondary | ICD-10-CM | POA: Diagnosis not present

## 2017-06-19 DIAGNOSIS — F419 Anxiety disorder, unspecified: Secondary | ICD-10-CM | POA: Insufficient documentation

## 2017-06-19 DIAGNOSIS — Z794 Long term (current) use of insulin: Secondary | ICD-10-CM | POA: Insufficient documentation

## 2017-06-19 DIAGNOSIS — E785 Hyperlipidemia, unspecified: Secondary | ICD-10-CM | POA: Insufficient documentation

## 2017-06-19 DIAGNOSIS — E669 Obesity, unspecified: Secondary | ICD-10-CM | POA: Diagnosis not present

## 2017-06-19 DIAGNOSIS — F329 Major depressive disorder, single episode, unspecified: Secondary | ICD-10-CM | POA: Diagnosis not present

## 2017-06-19 DIAGNOSIS — E162 Hypoglycemia, unspecified: Secondary | ICD-10-CM | POA: Diagnosis not present

## 2017-06-19 DIAGNOSIS — F17210 Nicotine dependence, cigarettes, uncomplicated: Secondary | ICD-10-CM | POA: Insufficient documentation

## 2017-06-19 DIAGNOSIS — E161 Other hypoglycemia: Secondary | ICD-10-CM | POA: Diagnosis not present

## 2017-06-19 DIAGNOSIS — K219 Gastro-esophageal reflux disease without esophagitis: Secondary | ICD-10-CM | POA: Diagnosis not present

## 2017-06-19 DIAGNOSIS — E11649 Type 2 diabetes mellitus with hypoglycemia without coma: Secondary | ICD-10-CM | POA: Diagnosis not present

## 2017-06-19 DIAGNOSIS — I10 Essential (primary) hypertension: Secondary | ICD-10-CM | POA: Diagnosis not present

## 2017-06-19 DIAGNOSIS — Z79891 Long term (current) use of opiate analgesic: Secondary | ICD-10-CM | POA: Diagnosis not present

## 2017-06-19 LAB — COMPREHENSIVE METABOLIC PANEL
ALBUMIN: 4.1 g/dL (ref 3.5–5.0)
ALK PHOS: 94 U/L (ref 38–126)
ALT: 39 U/L (ref 14–54)
ANION GAP: 10 (ref 5–15)
AST: 42 U/L — AB (ref 15–41)
BILIRUBIN TOTAL: 0.3 mg/dL (ref 0.3–1.2)
BUN: 17 mg/dL (ref 6–20)
CALCIUM: 9.3 mg/dL (ref 8.9–10.3)
CO2: 23 mmol/L (ref 22–32)
Chloride: 108 mmol/L (ref 101–111)
Creatinine, Ser: 1.05 mg/dL — ABNORMAL HIGH (ref 0.44–1.00)
GFR calc Af Amer: >60 mL/min (ref >60)
GFR calc non Af Amer: 53 mL/min — ABNORMAL LOW (ref >60)
GLUCOSE: 36 mg/dL — AB (ref 65–99)
Potassium: 3.4 mmol/L — ABNORMAL LOW (ref 3.5–5.1)
Sodium: 141 mmol/L (ref 135–145)
TOTAL PROTEIN: 7.2 g/dL (ref 6.5–8.1)

## 2017-06-19 LAB — CBC WITH DIFFERENTIAL/PLATELET
BASOS ABS: 0 10*3/uL (ref 0.0–0.1)
BASOS PCT: 0 %
EOS PCT: 1 %
Eosinophils Absolute: 0.1 10*3/uL (ref 0.0–0.7)
HEMATOCRIT: 36.5 % (ref 36.0–46.0)
Hemoglobin: 12.4 g/dL (ref 12.0–15.0)
Lymphocytes Relative: 29 %
Lymphs Abs: 2.1 10*3/uL (ref 0.7–4.0)
MCH: 30.5 pg (ref 26.0–34.0)
MCHC: 34 g/dL (ref 30.0–36.0)
MCV: 89.9 fL (ref 78.0–100.0)
MONO ABS: 0.6 10*3/uL (ref 0.1–1.0)
MONOS PCT: 9 %
NEUTROS ABS: 4.3 10*3/uL (ref 1.7–7.7)
Neutrophils Relative %: 61 %
Platelets: 271 10*3/uL (ref 150–400)
RBC: 4.06 MIL/uL (ref 3.87–5.11)
RDW: 13.3 % (ref 11.5–15.5)
WBC: 7.1 10*3/uL (ref 4.0–10.5)

## 2017-06-19 LAB — URINALYSIS, ROUTINE W REFLEX MICROSCOPIC
BILIRUBIN URINE: NEGATIVE
GLUCOSE, UA: NEGATIVE mg/dL
HGB URINE DIPSTICK: NEGATIVE
Leukocytes, UA: NEGATIVE
Nitrite: NEGATIVE
PROTEIN: NEGATIVE mg/dL
Specific Gravity, Urine: 1.02 (ref 1.005–1.030)
pH: 5.5 (ref 5.0–8.0)

## 2017-06-19 LAB — POC OCCULT BLOOD, ED: Fecal Occult Bld: NEGATIVE

## 2017-06-19 LAB — CBG MONITORING, ED
GLUCOSE-CAPILLARY: 50 mg/dL — AB (ref 65–99)
GLUCOSE-CAPILLARY: 93 mg/dL (ref 65–99)
GLUCOSE-CAPILLARY: 97 mg/dL (ref 65–99)
Glucose-Capillary: 25 mg/dL — CL (ref 65–99)
Glucose-Capillary: 95 mg/dL (ref 65–99)

## 2017-06-19 MED ORDER — DEXTROSE 50 % IV SOLN
25.0000 g | Freq: Once | INTRAVENOUS | Status: AC
  Administered 2017-06-19: 25 g via INTRAVENOUS
  Filled 2017-06-19: qty 50

## 2017-06-19 MED ORDER — DEXTROSE 5 % IV SOLN
Freq: Once | INTRAVENOUS | Status: AC
Start: 2017-06-19 — End: 2017-06-19
  Administered 2017-06-19: 18:00:00 via INTRAVENOUS

## 2017-06-19 NOTE — ED Notes (Signed)
When checking on patient in the bathroom, patient reports "I am cleaning myself up, I am bleeding from my rectum." MD made aware.

## 2017-06-19 NOTE — ED Notes (Signed)
Pt. Blood suger was 25.  Nurse aware. Called Dr. Oleta Mouse she stated to give pt. Orange juice and a sandwich. Will check blood sugar when pt.is finished.

## 2017-06-19 NOTE — ED Notes (Signed)
Patient given sandwich and apple juice 8 ounces.

## 2017-06-19 NOTE — ED Notes (Signed)
MD at bedside. 

## 2017-06-19 NOTE — ED Notes (Signed)
Secretary at [facility] made aware patient left AMA.

## 2017-06-19 NOTE — ED Notes (Addendum)
Per admitting nurse, upon going in patient's room, patient was removing IV stating "I am leaving, I have been here long enough." When this writer went to explain to patient she had a bed ready at [facility], patient was fully dressed and began walking out. Patient refused to sign AMA form stating, "I don't feel like it." Patient last seen alert and oriented, ambulatory without difficulty, in NAD.  Hospitalist made aware patient left AMA.

## 2017-06-19 NOTE — ED Triage Notes (Signed)
Per [facility] pt from office, initial call was for dark green stools, hypoglycemic at the scene, corrected with oral instant glucose 15 g, CBG increased to 70. A and O x 4 on arrival. No co at this time,

## 2017-06-19 NOTE — ED Provider Notes (Signed)
[facility] DEPT Provider Note   CSN: [id number] Arrival date & time: 06/19/17  1259     History   Chief Complaint Chief Complaint  Patient presents with  . Hypoglycemia    HPI Adalene Gulotta is a 66 y.o. female.  HPI  66 year old female who presents with episode of hypoglycemia. She has history of DM, GERD, HLD. States today, she felt "sick" like she was dizzy and high. States that she has had intermittent episodes of this since May, after she was hospitalized. States she has been dieting and ate less than what she normally does. EMS called, and she was hypoglycemia. She was given oral glucose 15 g and CBG increased to 70. Denies fever, chest pain, dyspnea, cough, abdominal pain, nausea, vomiting, diarrhea, abdominal pain, dysuria, urinary frequency. Feels better now. No new changes to medications recently.  Past Medical History:  Diagnosis Date  . Allergy   . Anxiety   . Arthritis   . Asthma   . Cataract   . Depression   . Diabetes mellitus    type II  . GERD (gastroesophageal reflux disease)   . Hyperlipidemia   . Hypertension   . Neuromuscular disorder ([place])   . Seizures [facility])    last seizure Jan 2016    Patient Active Problem List   Diagnosis Date Noted  . Diverticulosis 05/08/2017  . Psychiatric illness 01/12/2017  . Hyperlipidemia 08/25/2016  . Obesity 06/20/2016  . Health care maintenance 05/17/2016  . Skin rash 05/17/2016  . Diabetic peripheral neuropathy associated with type 2 diabetes mellitus ([place]) 05/17/2016  . GERD (gastroesophageal reflux disease) 05/17/2016  . Tobacco use 05/17/2016  . Back pain of thoracolumbar region 11/08/2015  . DM type 2 (diabetes mellitus, type 2) ([place]) 05/03/2012  . Hypertension 05/03/2012  . Anxiety 05/03/2012    Past Surgical History:  Procedure Laterality Date  . APPENDECTOMY    . CYSTECTOMY    . FOOT SURGERY Bilateral 2001   x3  . SALPINGOOPHORECTOMY Left   . TUBAL LIGATION      OB History    Gravida  Para Term Preterm AB Living   '4 2 2   2 2   ' SAB TAB Ectopic Multiple Live Births   2       2       Home Medications    Prior to Admission medications   Medication Sig Start Date End Date Taking? Authorizing Provider  amLODipine (NORVASC) 10 MG tablet Take 1 tablet (10 mg total) by mouth daily. 11/21/16  Yes Shela Leff, MD  atorvastatin (LIPITOR) 40 MG tablet Take 1 tablet (40 mg total) by mouth daily. 11/21/16 11/21/17 Yes Shela Leff, MD  gabapentin (NEURONTIN) 800 MG tablet Take 1 tablet (800 mg total) by mouth 3 (three) times daily. 11/21/16  Yes Shela Leff, MD  Insulin Detemir (LEVEMIR FLEXPEN) 100 UNIT/ML Pen Inject 25 Units into the skin at bedtime. 01/30/17  Yes Shela Leff, MD  latanoprost (XALATAN) 0.005 % ophthalmic solution Place 1 drop into both eyes at bedtime.  06/18/17  Yes [provider]  lisinopril-hydrochlorothiazide (ZESTORETIC) 20-12.5 MG tablet Take 2 tablets by mouth daily. 11/21/16 11/21/17 Yes Shela Leff, MD  metFORMIN (GLUCOPHAGE) 1000 MG tablet Take 1 tablet (1,000 mg total) by mouth 2 (two) times daily with a meal. 11/21/16  Yes Shela Leff, MD  omeprazole (PRILOSEC) 40 MG capsule Take 1 capsule (40 mg total) by mouth daily. 02/14/17  Yes Shela Leff, MD  ACCU-CHEK FASTCLIX LANCETS MISC Use to check  blood sugar 3 times a day. Dx code E11.9 03/08/17   Shela Leff, MD  Blood Glucose Monitoring Suppl (ACCU-CHEK AVIVA PLUS) w/Device KIT USE   TO CHECK GLUCOSE TWICE DAILY 10/11/16   Shela Leff, MD  glucose blood (ACCU-CHEK GUIDE) test strip Use to check blood sugar 3 times a day. Dx code E11.9 03/23/17   Shela Leff, MD  Insulin Pen Needle (PEN NEEDLES) 31G X 5 MM MISC 1 each by Does not apply route daily. 03/08/17   Shela Leff, MD  Insulin Syringe-Needle U-100 31G X 15/64" 0.3 ML MISC Use to inject lantus insulin one time a day 02/21/17   Shela Leff, MD  Lancet Devices [facility]) lancets Use to test blood glucose 3 times daily. Dx: E11.49, insulin dependent 03/23/17   Shela Leff, MD  oxyCODONE-acetaminophen (PERCOCET/ROXICET) 5-325 MG tablet TK 1 T PO BID PRN PAIN 04/18/17   [provider]    Family History Family History  Problem Relation Age of Onset  . Heart disease Mother   . Diabetes Mother   . Depression Mother   . Cancer Father   . Diabetes Father   . Depression Father   . Colon cancer Father   . Diabetes Sister   . Kidney disease Brother   . Diabetes Brother   . Kidney disease Brother   . Diabetes Brother   . Diabetes Brother   . Diabetes Brother   . Colon cancer Brother   . Esophageal cancer Neg Hx   . Rectal cancer Neg Hx   . Stomach cancer Neg Hx   . Pancreatic cancer Neg Hx     Social History Social History  Substance Use Topics  . Smoking status: Current Every Day Smoker    Packs/day: 0.50    Types: Cigarettes  . Smokeless tobacco: Never Used     Comment: DOWN TO 3 CIGARETTES DAY  . Alcohol use 0.0 oz/week     Comment: 5 shots of whisky a day.      Allergies   Aspirin; Bupropion; Other; and Chocolate   Review of Systems Review of Systems  Constitutional: Negative for fever.  Respiratory: Negative for shortness of breath.   Cardiovascular: Negative for chest pain.  Gastrointestinal: Negative for abdominal pain.  All other systems reviewed and are negative.    Physical Exam Updated Vital Signs BP 119/70   Pulse 81   Temp 98 F (36.7 C) (Oral)   Resp 18   SpO2 100%   Physical Exam Physical Exam  Nursing note and vitals reviewed. Constitutional: Well developed, well nourished, non-toxic, and in no acute distress Head: Normocephalic and atraumatic.  Mouth/Throat: Oropharynx is clear and moist.  Neck: Normal range of motion. Neck supple.  Cardiovascular: Normal rate and regular rhythm.   Pulmonary/Chest: Effort normal and breath sounds normal.  Abdominal: Soft. There is no tenderness.  There is no rebound and no guarding.  Musculoskeletal: Normal range of motion.  Neurological: Alert, no facial droop, fluent speech, moves all extremities symmetrically Skin: Skin is warm and dry.  Psychiatric: Cooperative   ED Treatments / Results  Labs (all labs ordered are listed, but only abnormal results are displayed) Labs Reviewed  COMPREHENSIVE METABOLIC PANEL - Abnormal; Notable for the following:       Result Value   Potassium 3.4 (*)    Glucose, Bld 36 (*)    Creatinine, Ser 1.05 (*)    AST 42 (*)    GFR calc non Af Amer 53 (*)  All other components within normal limits  URINALYSIS, ROUTINE W REFLEX MICROSCOPIC - Abnormal; Notable for the following:    Ketones, ur TRACE (*)    All other components within normal limits  CBG MONITORING, ED - Abnormal; Notable for the following:    Glucose-Capillary 50 (*)    All other components within normal limits  CBG MONITORING, ED - Abnormal; Notable for the following:    Glucose-Capillary 25 (*)    All other components within normal limits  CBC WITH DIFFERENTIAL/PLATELET  CBG MONITORING, ED  CBG MONITORING, ED  POC OCCULT BLOOD, ED  CBG MONITORING, ED  CBG MONITORING, ED    EKG  EKG Interpretation None       Radiology No results found.  Procedures Procedures (including critical care time) CRITICAL CARE Performed by: Forde Dandy   Total critical care time: 35 minutes  Critical care time was exclusive of separately billable procedures and treating other patients.  Critical care was necessary to treat or prevent imminent or life-threatening deterioration.  Critical care was time spent personally by me on the following activities: development of treatment plan with patient and/or surrogate as well as nursing, discussions with consultants, evaluation of patient's response to treatment, examination of patient, obtaining history from patient or surrogate, ordering and performing treatments and interventions,  ordering and review of laboratory studies, ordering and review of radiographic studies, pulse oximetry and re-evaluation of patient's condition.  Medications Ordered in ED Medications  dextrose 50 % solution 25 g (25 g Intravenous Given 06/19/17 1615)  dextrose 5 % solution ( Intravenous New Bag/Given 06/19/17 1735)     Initial Impression / Assessment and Plan / ED Course  I have reviewed the triage vital signs and the nursing notes.  Pertinent labs & imaging results that were available during my care of the patient were reviewed by me and considered in my medical decision making (see chart for details).     66 year old female who presents with hypoglycemia. She is fully oriented, but has inappropriate speech, tangential speech, flight of ideas. Seems similar to when she was admitted in May for manic episode without the hypoglycemia on chart review.  Has 2 episodes of recurrent hypoglycemia in ED, despite multiple sandwiches. She also received amp of D50 with glucose only in the 90s afterwards, and slowly down trending. Given anticipated hypoglycemia, placed on dextrose drip. UA, blood work otherwise unremarkable. Concern that she may not be taking medications correctly given her underlying psychiatric problem. Also decreased PO today with reported dieting that could play a role. May also need psychiatric evaluation once medical issues have been treatment.  Discussed with internal medicine teaching service. Will admit under Dr. Beryle Beams.  Final Clinical Impressions(s) / ED Diagnoses   Final diagnoses:  Hypoglycemia    New Prescriptions New Prescriptions   No medications on file     Forde Dandy, MD 06/19/17 1739

## 2017-06-21 ENCOUNTER — Encounter: Payer: Self-pay | Admitting: Internal Medicine

## 2017-07-03 ENCOUNTER — Encounter: Payer: Self-pay | Admitting: Internal Medicine

## 2017-07-09 ENCOUNTER — Ambulatory Visit: Payer: Medicare HMO | Admitting: Podiatry

## 2017-07-24 ENCOUNTER — Encounter: Payer: Medicare HMO | Admitting: Internal Medicine

## 2017-07-24 ENCOUNTER — Other Ambulatory Visit: Payer: Self-pay | Admitting: Internal Medicine

## 2017-07-30 ENCOUNTER — Telehealth (HOSPITAL_COMMUNITY): Payer: Self-pay

## 2017-07-31 ENCOUNTER — Encounter: Payer: Self-pay | Admitting: Nurse Practitioner

## 2017-07-31 ENCOUNTER — Other Ambulatory Visit: Payer: Self-pay | Admitting: Nurse Practitioner

## 2017-07-31 ENCOUNTER — Ambulatory Visit: Payer: Medicare HMO | Admitting: Nurse Practitioner

## 2017-07-31 VITALS — BP 122/64 | HR 96 | Temp 97.4°F | Ht 66.0 in | Wt 163.0 lb

## 2017-07-31 DIAGNOSIS — I10 Essential (primary) hypertension: Secondary | ICD-10-CM | POA: Diagnosis not present

## 2017-07-31 DIAGNOSIS — R110 Nausea: Secondary | ICD-10-CM

## 2017-07-31 DIAGNOSIS — E1142 Type 2 diabetes mellitus with diabetic polyneuropathy: Secondary | ICD-10-CM

## 2017-07-31 DIAGNOSIS — Z794 Long term (current) use of insulin: Secondary | ICD-10-CM

## 2017-07-31 DIAGNOSIS — E11649 Type 2 diabetes mellitus with hypoglycemia without coma: Secondary | ICD-10-CM

## 2017-07-31 DIAGNOSIS — F99 Mental disorder, not otherwise specified: Secondary | ICD-10-CM

## 2017-07-31 DIAGNOSIS — K219 Gastro-esophageal reflux disease without esophagitis: Secondary | ICD-10-CM

## 2017-07-31 DIAGNOSIS — R8290 Unspecified abnormal findings in urine: Secondary | ICD-10-CM | POA: Diagnosis not present

## 2017-07-31 LAB — BASIC METABOLIC PANEL
BUN: 25 mg/dL — ABNORMAL HIGH (ref 6–23)
CO2: 28 meq/L (ref 19–32)
CREATININE: 1.47 mg/dL — AB (ref 0.40–1.20)
Calcium: 10.4 mg/dL (ref 8.4–10.5)
Chloride: 101 mEq/L (ref 96–112)
GFR: 45.37 mL/min — ABNORMAL LOW (ref >60.00)
Glucose, Bld: 72 mg/dL (ref 70–99)
POTASSIUM: 4.9 meq/L (ref 3.5–5.1)
Sodium: 139 mEq/L (ref 135–145)

## 2017-07-31 LAB — HEMOGLOBIN A1C: Hgb A1c MFr Bld: 6.6 % — ABNORMAL HIGH (ref 4.6–6.5)

## 2017-07-31 LAB — MICROALBUMIN / CREATININE URINE RATIO
Creatinine,U: 209.6 mg/dL
MICROALB/CREAT RATIO: 14.3 mg/g (ref 0.0–30.0)
Microalb, Ur: 29.9 mg/dL — ABNORMAL HIGH (ref 0.0–1.9)

## 2017-07-31 MED ORDER — ONDANSETRON HCL 4 MG PO TABS: 4.0000 mg | ORAL_TABLET | Freq: Three times a day (TID) | ORAL | 0 refills | Status: DC | PRN

## 2017-07-31 MED ORDER — OMEPRAZOLE 20 MG PO CPDR: 20.0000 mg | DELAYED_RELEASE_CAPSULE | Freq: Every day | ORAL | 0 refills | Status: DC

## 2017-07-31 MED ORDER — RANITIDINE HCL 150 MG PO TABS: 150.0000 mg | ORAL_TABLET | Freq: Every day | ORAL | 0 refills | Status: DC

## 2017-07-31 NOTE — Progress Notes (Signed)
Subjective:    Patient ID: [name], female    DOB: [birth year], 66 y.o.   MRN: [id number]  Patient presents today for establish care (new patient)  HPI  Schizophrenia: Does not want to see psychiatry. Does not want any medication.  Home alone. Transportation: walking, use of [place].  Does not want any contact with immediate family: daughter and 2 grand daughter, 1 grandson. Has a brother in [place].  [facility] with [facility]: Dr. Jodi Mourning.  Podiatry: Dr. Jacqualyn Posey. Needs another referral.  ABD pain and nausea: Chronic, intermittent Some relief with tums and peptobismol.  Immunizations: (TDAP, Hep C screen, Pneumovax, Influenza, zoster)  Health Maintenance  Topic Date Due  . Eye exam for diabetics  11/22/1958  . DEXA scan (bone density measurement)  11/21/2013  . Complete foot exam   10/11/2016  . Flu Shot  04/11/2017  . Hemoglobin A1C  10/31/2017  . Pneumonia vaccines (2 of 2 - PPSV23) 01/30/2018  . Mammogram  08/31/2018  . Colon Cancer Screening  05/07/2022  . Tetanus Vaccine  05/16/2026  .  Hepatitis C: One time screening is recommended by Center for Disease Control  (CDC) for  adults born from [birth year] through 1967.   Completed   Diet:  Weight:  Wt Readings from Last 3 Encounters:  07/31/17 163 lb (73.9 kg)  05/07/17 178 lb (80.7 kg)  04/26/17 178 lb 3.2 oz (80.8 kg)   Fall Risk: Fall Risk  03/20/2017 01/30/2017 01/12/2017 12/06/2016 11/01/2016 06/20/2016 12/02/2015  Falls in the past year? _0  No No  Comment - - - WET LEAVES - - -   Home Safety:home alone.  Depression/Suicide: Depression screen [facility] 2/9 03/20/2017 01/30/2017 01/12/2017 12/06/2016 11/01/2016 06/20/2016 05/16/2016  Decreased Interest 0 0 0 0 0 0 0  Down, Depressed, Hopeless 0 0 0 0 0 0 0  PHQ - 2 Score 0 0 0 0 0 0 0   Advanced Directive: Advanced Directives 06/19/2017  Does Patient Have a Medical Advance Directive? No  Type of Advance Directive -  Does patient want to make changes to medical  advance directive? -  Copy of [place] in Chart? -  Would patient like information on creating a medical advance directive? -    Medications and allergies reviewed with patient and updated if appropriate.  Patient Active Problem List   Diagnosis Date Noted  . Hypoglycemia 06/19/2017  . Diverticulosis 05/08/2017  . Psychiatric illness 01/12/2017  . Hyperlipidemia 08/25/2016  . Obesity 06/20/2016  . Health care maintenance 05/17/2016  . Skin rash 05/17/2016  . Diabetic peripheral neuropathy associated with type 2 diabetes mellitus ([place]) 05/17/2016  . GERD (gastroesophageal reflux disease) 05/17/2016  . Tobacco use 05/17/2016  . Back pain of thoracolumbar region 11/08/2015  . DM type 2 (diabetes mellitus, type 2) ([place]) 05/03/2012  . Hypertension 05/03/2012  . Anxiety 05/03/2012    Current Outpatient Medications on File Prior to Visit  Medication Sig Dispense Refill  . ACCU-CHEK FASTCLIX LANCETS MISC Use to check blood sugar 3 times a day. Dx code E11.9 102 each 12  . amLODipine (NORVASC) 10 MG tablet Take 1 tablet (10 mg total) by mouth daily. 90 tablet 3  . atorvastatin (LIPITOR) 40 MG tablet Take 1 tablet (40 mg total) by mouth daily. 90 tablet 3  . Blood Glucose Monitoring Suppl (ACCU-CHEK AVIVA PLUS) w/Device KIT USE   TO CHECK GLUCOSE TWICE DAILY 1 kit 0  . gabapentin (NEURONTIN) 800 MG tablet TAKE 1  TABLET   3 (THREE) TIMES DAILY. 270 tablet 1  . glucose blood (ACCU-CHEK GUIDE) test strip Use to check blood sugar 3 times a day. Dx code E11.9 100 each 12  . Insulin Detemir (LEVEMIR FLEXPEN) 100 UNIT/ML Pen Inject 25 Units into the skin at bedtime. 45 mL 3  . Insulin Pen Needle (PEN NEEDLES) 31G X 5 MM MISC 1 each by Does not apply route daily. 100 each 4  . Lancet Devices (ACCU-CHEK SOFTCLIX) lancets Use to test blood glucose 3 times daily. Dx: E11.49, insulin dependent 300 each 3  . latanoprost (XALATAN) 0.005 % ophthalmic solution Place 1 drop into both  eyes at bedtime.     . metFORMIN (GLUCOPHAGE) 1000 MG tablet Take 1 tablet (1,000 mg total) by mouth 2 (two) times daily with a meal. 180 tablet 3  . Insulin Syringe-Needle U-100 31G X 15/64" 0.3 ML MISC Use to inject lantus insulin one time a day (Patient not taking: Reported on 07/31/2017) 100 each 11   Current Facility-Administered Medications on File Prior to Visit  Medication Dose Route Frequency Provider Last Rate Last Dose  . 0.9 %  sodium chloride infusion  500 mL Intravenous Continuous Doran Stabler, MD        Past Medical History:  Diagnosis Date  . Allergy   . Anxiety   . Arthritis   . Asthma   . Cataract   . Depression   . Diabetes mellitus    type II  . GERD (gastroesophageal reflux disease)   . Hyperlipidemia   . Hypertension   . Neuromuscular disorder ([place])   . Seizures ([place])    last seizure Jan 2016    Past Surgical History:  Procedure Laterality Date  . APPENDECTOMY    . CYSTECTOMY    . FOOT SURGERY Bilateral 2001   x3  . SALPINGOOPHORECTOMY Left   . TUBAL LIGATION      Social History   Socioeconomic History  . Marital status: Single    Spouse name: None  . Number of children: None  . Years of education: None  . Highest education level: None  Social Needs  . Financial resource strain: None  . Food insecurity - worry: None  . Food insecurity - inability: None  . Transportation needs - medical: None  . Transportation needs - non-medical: None  Occupational History  . None  Tobacco Use  . Smoking status: Current Every Day Smoker    Packs/day: 1.00    Types: Cigarettes  . Smokeless tobacco: Never Used  . Tobacco comment: DOWN TO 3 CIGARETTES DAY  Substance and Sexual Activity  . Alcohol use: Yes    Alcohol/week: 0.0 oz    Comment: 5 shots of whisky a day.   . Drug use: No  . Sexual activity: Not Currently    Partners: Male    Birth control/protection: Post-menopausal  Other Topics Concern  . None  Social History Narrative  . None     Family History  Problem Relation Age of Onset  . Heart disease Mother   . Diabetes Mother   . Depression Mother   . Cancer Father   . Diabetes Father   . Depression Father   . Colon cancer Father   . Diabetes Sister   . Kidney disease Brother   . Diabetes Brother   . Kidney disease Brother   . Diabetes Brother   . Diabetes Brother   . Diabetes Brother   . Colon cancer Brother   .  Esophageal cancer Neg Hx   . Rectal cancer Neg Hx   . Stomach cancer Neg Hx   . Pancreatic cancer Neg Hx         Review of Systems  Constitutional: Negative for chills and fever.  Cardiovascular: Negative for chest pain and palpitations.  Gastrointestinal: Positive for abdominal pain, heartburn, nausea and vomiting. Negative for blood in stool, constipation, diarrhea and melena.  Genitourinary: Negative.   Musculoskeletal: Negative for falls and myalgias.  Skin: Negative.     Objective:   Vitals:   07/31/17 1321  BP: 122/64  Pulse: 96  Temp: (!) 97.4 F (36.3 C)  SpO2: 95%    Body mass index is 26.31 kg/m.   Physical Examination:  Physical Exam  Constitutional: No distress.  Cardiovascular: Normal rate and regular rhythm.  Pulmonary/Chest: Effort normal and breath sounds normal.  Neurological: She is alert. Gait normal.  Skin: Skin is warm and dry.  Psychiatric:  Pressured speech, disorganized thought  Vitals reviewed.   ASSESSMENT and PLAN:  Chakara was seen today for establish care and rash.  Diagnoses and all orders for this visit:  Type 2 diabetes mellitus with hypoglycemia without coma, with long-term current use of insulin (HCC) -     Hemoglobin A1c -     Microalbumin / creatinine urine ratio  Psychiatric illness  Gastroesophageal reflux disease, esophagitis presence not specified -     omeprazole (PRILOSEC) 20 MG capsule; Take 1 capsule (20 mg total) by mouth daily. -     ranitidine (ZANTAC) 150 MG tablet; Take 1 tablet (150 mg total) by mouth at  bedtime.  Essential hypertension -     Basic metabolic panel -     lisinopril (PRINIVIL,ZESTRIL) 20 MG tablet; Take 1 tablet (20 mg total) by mouth daily.  Diabetic peripheral neuropathy associated with type 2 diabetes mellitus (HCC)  Nausea -     ondansetron (ZOFRAN) 4 MG tablet; Take 1 tablet (4 mg total) by mouth every 8 (eight) hours as needed for nausea or vomiting. -     Urinalysis w microscopic + reflex cultur   No problem-specific Assessment & Plan notes found for this encounter.     Follow up: Return in about 4 weeks (around 08/28/2017) for DM and HTN.  Wilfred Lacy, NP

## 2017-07-31 NOTE — Patient Instructions (Addendum)
Stable DM with decline in renal function, due to DM. Changed HTN medication to lisinopril only. Encourage increase oral hydration. Return to office in 53month.  Heartburn Heartburn is a type of pain or discomfort that can happen in the throat or chest. It is often described as a burning pain. It may also cause a bad taste in the mouth. Heartburn may feel worse when you lie down or bend over. It may be caused by stomach contents that move back up (reflux) into the tube that connects the mouth with the stomach (esophagus). Follow these instructions at home: Take these actions to lessen your discomfort and to help avoid problems. Diet  Follow a diet as told by your doctor. You may need to avoid foods and drinks such as: ? Coffee and tea (with or without caffeine). ? Drinks that contain alcohol. ? Energy drinks and sports drinks. ? Carbonated drinks or sodas. ? Chocolate and cocoa. ? Peppermint and mint flavorings. ? Garlic and onions. ? Horseradish. ? Spicy and acidic foods, such as peppers, chili powder, curry powder, vinegar, hot sauces, and BBQ sauce. ? Citrus fruit juices and citrus fruits, such as oranges, lemons, and limes. ? Tomato-based foods, such as red sauce, chili, salsa, and pizza with red sauce. ? Fried and fatty foods, such as donuts, french fries, potato chips, and high-fat dressings. ? High-fat meats, such as hot dogs, rib eye steak, sausage, ham, and bacon. ? High-fat dairy items, such as whole milk, butter, and cream cheese.  Eat small meals often. Avoid eating large meals.  Avoid drinking large amounts of liquid with your meals.  Avoid eating meals during the 2-3 hours before bedtime.  Avoid lying down right after you eat.  Do not exercise right after you eat. General instructions  Pay attention to any changes in your symptoms.  Take over-the-counter and prescription medicines only as told by your doctor. Do not take aspirin, ibuprofen, or other NSAIDs unless  your doctor says it is okay.  Do not use any tobacco products, including cigarettes, chewing tobacco, and e-cigarettes. If you need help quitting, ask your doctor.  Wear loose clothes. Do not wear anything tight around your waist.  Raise (elevate) the head of your bed about 6 inches (15 cm).  Try to lower your stress. If you need help doing this, ask your doctor.  If you are overweight, lose an amount of weight that is healthy for you. Ask your doctor about a safe weight loss goal.  Keep all follow-up visits as told by your doctor. This is important. Contact a doctor if:  You have new symptoms.  You lose weight and you do not know why it is happening.  You have trouble swallowing, or it hurts to swallow.  You have wheezing or a cough that keeps happening.  Your symptoms do not get better with treatment.  You have heartburn often for more than two weeks. Get help right away if:  You have pain in your arms, neck, jaw, teeth, or back.  You feel sweaty, dizzy, or light-headed.  You have chest pain or shortness of breath.  You throw up (vomit) and your throw up looks like blood or coffee grounds.  Your poop (stool) is bloody or black. This information is not intended to replace advice given to you by your health care provider. Make sure you discuss any questions you have with your health care provider. Document Released: 05/10/2011 Document Revised: 02/03/2016 Document Reviewed: 12/23/2014 Elsevier Interactive Patient Education  2018  [employer].

## 2017-08-05 ENCOUNTER — Encounter: Payer: Self-pay | Admitting: Nurse Practitioner

## 2017-08-05 MED ORDER — LISINOPRIL 20 MG PO TABS: 20.0000 mg | ORAL_TABLET | Freq: Every day | ORAL | 2 refills | Status: DC

## 2017-08-06 LAB — URINALYSIS W MICROSCOPIC + REFLEX CULTURE
BILIRUBIN URINE: NEGATIVE
Glucose, UA: NEGATIVE
Hgb urine dipstick: NEGATIVE
Hyaline Cast: NONE SEEN /LPF
NITRITES URINE, INITIAL: NEGATIVE
RBC / HPF: NONE SEEN /HPF (ref 0–2)
Specific Gravity, Urine: 1.022 (ref 1.001–1.03)
pH: 6 (ref 5.0–8.0)

## 2017-08-06 LAB — URINE CULTURE
MICRO NUMBER:: 81314336
SPECIMEN QUALITY:: ADEQUATE

## 2017-08-06 LAB — CULTURE INDICATED

## 2017-08-06 LAB — EXTRA URINE SPECIMEN

## 2017-08-09 ENCOUNTER — Other Ambulatory Visit: Payer: Self-pay | Admitting: Family Medicine

## 2017-08-09 DIAGNOSIS — Z1231 Encounter for screening mammogram for malignant neoplasm of breast: Secondary | ICD-10-CM

## 2017-08-14 ENCOUNTER — Other Ambulatory Visit: Payer: Self-pay | Admitting: Nurse Practitioner

## 2017-08-14 DIAGNOSIS — N3001 Acute cystitis with hematuria: Secondary | ICD-10-CM

## 2017-08-14 MED ORDER — NITROFURANTOIN MONOHYD MACRO 100 MG PO CAPS: 100.0000 mg | ORAL_CAPSULE | Freq: Two times a day (BID) | ORAL | 0 refills | Status: DC

## 2017-08-15 ENCOUNTER — Encounter: Payer: Self-pay | Admitting: Nurse Practitioner

## 2017-08-21 ENCOUNTER — Encounter (HOSPITAL_COMMUNITY): Payer: Self-pay | Admitting: Emergency Medicine

## 2017-08-21 ENCOUNTER — Emergency Department (HOSPITAL_COMMUNITY): Payer: Medicare HMO

## 2017-08-21 ENCOUNTER — Emergency Department (HOSPITAL_COMMUNITY)
Admission: EM | Admit: 2017-08-21 | Discharge: 2017-08-21 | Disposition: A | Discharge status: Home / Self Care | Payer: Medicare HMO | Attending: Emergency Medicine | Admitting: Emergency Medicine

## 2017-08-21 DIAGNOSIS — I10 Essential (primary) hypertension: Secondary | ICD-10-CM | POA: Diagnosis not present

## 2017-08-21 DIAGNOSIS — R2240 Localized swelling, mass and lump, unspecified lower limb: Secondary | ICD-10-CM | POA: Diagnosis not present

## 2017-08-21 DIAGNOSIS — R531 Weakness: Secondary | ICD-10-CM | POA: Insufficient documentation

## 2017-08-21 DIAGNOSIS — R42 Dizziness and giddiness: Secondary | ICD-10-CM | POA: Diagnosis not present

## 2017-08-21 DIAGNOSIS — R05 Cough: Secondary | ICD-10-CM | POA: Diagnosis not present

## 2017-08-21 DIAGNOSIS — R739 Hyperglycemia, unspecified: Secondary | ICD-10-CM | POA: Insufficient documentation

## 2017-08-21 DIAGNOSIS — F17210 Nicotine dependence, cigarettes, uncomplicated: Secondary | ICD-10-CM | POA: Diagnosis not present

## 2017-08-21 DIAGNOSIS — J45909 Unspecified asthma, uncomplicated: Secondary | ICD-10-CM | POA: Diagnosis not present

## 2017-08-21 DIAGNOSIS — E1140 Type 2 diabetes mellitus with diabetic neuropathy, unspecified: Secondary | ICD-10-CM | POA: Insufficient documentation

## 2017-08-21 DIAGNOSIS — Z794 Long term (current) use of insulin: Secondary | ICD-10-CM | POA: Diagnosis not present

## 2017-08-21 DIAGNOSIS — R21 Rash and other nonspecific skin eruption: Secondary | ICD-10-CM

## 2017-08-21 DIAGNOSIS — T50904A Poisoning by unspecified drugs, medicaments and biological substances, undetermined, initial encounter: Secondary | ICD-10-CM | POA: Diagnosis not present

## 2017-08-21 LAB — CBG MONITORING, ED: Glucose-Capillary: 149 mg/dL — ABNORMAL HIGH (ref 65–99)

## 2017-08-21 LAB — CBC WITH DIFFERENTIAL/PLATELET
BASOS PCT: 0 %
Basophils Absolute: 0 10*3/uL (ref 0.0–0.1)
EOS ABS: 0.2 10*3/uL (ref 0.0–0.7)
Eosinophils Relative: 2 %
HCT: 35 % — ABNORMAL LOW (ref 36.0–46.0)
HEMOGLOBIN: 12.1 g/dL (ref 12.0–15.0)
LYMPHS ABS: 2.4 10*3/uL (ref 0.7–4.0)
Lymphocytes Relative: 33 %
MCH: 31 pg (ref 26.0–34.0)
MCHC: 34.6 g/dL (ref 30.0–36.0)
MCV: 89.7 fL (ref 78.0–100.0)
Monocytes Absolute: 0.6 10*3/uL (ref 0.1–1.0)
Monocytes Relative: 8 %
NEUTROS PCT: 57 %
Neutro Abs: 4 10*3/uL (ref 1.7–7.7)
Platelets: 229 10*3/uL (ref 150–400)
RBC: 3.9 MIL/uL (ref 3.87–5.11)
RDW: 13.2 % (ref 11.5–15.5)
WBC: 7.1 10*3/uL (ref 4.0–10.5)

## 2017-08-21 LAB — COMPREHENSIVE METABOLIC PANEL
ALBUMIN: 4.4 g/dL (ref 3.5–5.0)
ALK PHOS: 81 U/L (ref 38–126)
ALT: 26 U/L (ref 14–54)
ANION GAP: 12 (ref 5–15)
AST: 30 U/L (ref 15–41)
BILIRUBIN TOTAL: 0.6 mg/dL (ref 0.3–1.2)
BUN: 21 mg/dL — ABNORMAL HIGH (ref 6–20)
CALCIUM: 9.7 mg/dL (ref 8.9–10.3)
CO2: 23 mmol/L (ref 22–32)
Chloride: 103 mmol/L (ref 101–111)
Creatinine, Ser: 1.25 mg/dL — ABNORMAL HIGH (ref 0.44–1.00)
GFR calc Af Amer: 50 mL/min — ABNORMAL LOW (ref >60)
GFR calc non Af Amer: 43 mL/min — ABNORMAL LOW (ref >60)
Glucose, Bld: 135 mg/dL — ABNORMAL HIGH (ref 65–99)
Potassium: 3.9 mmol/L (ref 3.5–5.1)
SODIUM: 138 mmol/L (ref 135–145)
Total Protein: 6.9 g/dL (ref 6.5–8.1)

## 2017-08-21 LAB — TROPONIN I: Troponin I: <0.03 ng/mL (ref <0.03)

## 2017-08-21 MED ORDER — ONDANSETRON 4 MG PO TBDP
4.0000 mg | ORAL_TABLET | Freq: Once | ORAL | Status: AC
  Administered 2017-08-21: 4 mg via ORAL
  Filled 2017-08-21: qty 1

## 2017-08-21 MED ORDER — MICONAZOLE NITRATE 2 % EX CREA
TOPICAL_CREAM | Freq: Two times a day (BID) | CUTANEOUS | Status: DC
  Administered 2017-08-21: 13:00:00 via TOPICAL
  Filled 2017-08-21: qty 28

## 2017-08-21 MED ORDER — ONDANSETRON 4 MG PO TBDP: 4.0000 mg | ORAL_TABLET | Freq: Three times a day (TID) | ORAL | 0 refills | Status: DC | PRN

## 2017-08-21 MED ORDER — SODIUM CHLORIDE 0.9 % IV SOLN
INTRAVENOUS | Status: DC
  Administered 2017-08-21: 10:00:00 via INTRAVENOUS

## 2017-08-21 NOTE — ED Notes (Signed)
Patient transported to X-ray 

## 2017-08-21 NOTE — ED Notes (Signed)
ED Provider at bedside. 
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
## 2017-08-21 NOTE — ED Triage Notes (Signed)
Patient adds that she is dizzy and feels like she is going to pass out.

## 2017-08-21 NOTE — ED Provider Notes (Signed)
[place] DEPT Provider Note   CSN: [id number] Arrival date & time: 08/21/17  6606     History   Chief Complaint Chief Complaint  Patient presents with  . Rash  . Cough  . Foot Swelling  . Dizziness  . Hyperglycemia    HPI [name] is a 66 y.o. female.  HPI Patient presents with concern of feeling poorly in general. Patient states that she has felt poorly for about 1 week, nausea, anorexia, but no vomiting, no diarrhea, no focal pain. She is a poor historian, gives an inconsistent account of her recent events, but it seems as though she has recently run out of some of her home medication, and her illness has developed during this timeframe. Patient states that she is worried about impostors coming into her home, but denies physical assault.  She denies confusion, but is seemingly confabulating her history over the past few days. Past Medical History:  Diagnosis Date  . Allergy   . Anxiety   . Arthritis   . Asthma   . Cataract   . Depression   . Diabetes mellitus    type II  . GERD (gastroesophageal reflux disease)   . Hyperlipidemia   . Hypertension   . Neuromuscular disorder ([place])   . Seizures [facility])    last seizure Jan 2016    Patient Active Problem List   Diagnosis Date Noted  . Hypoglycemia 06/19/2017  . Diverticulosis 05/08/2017  . Psychiatric illness 01/12/2017  . Hyperlipidemia 08/25/2016  . Obesity 06/20/2016  . Health care maintenance 05/17/2016  . Skin rash 05/17/2016  . Diabetic peripheral neuropathy associated with type 2 diabetes mellitus ([place]) 05/17/2016  . GERD (gastroesophageal reflux disease) 05/17/2016  . Tobacco use 05/17/2016  . Back pain of thoracolumbar region 11/08/2015  . DM type 2 (diabetes mellitus, type 2) ([place]) 05/03/2012  . Hypertension 05/03/2012  . Anxiety 05/03/2012    Past Surgical History:  Procedure Laterality Date  . APPENDECTOMY    . CYSTECTOMY    . FOOT SURGERY Bilateral 2001    x3  . SALPINGOOPHORECTOMY Left   . TUBAL LIGATION      OB History    Gravida Para Term Preterm AB Living   _0 SAB TAB Ectopic Multiple Live Births   2       2       Home Medications    Prior to Admission medications   Medication Sig Start Date End Date Taking? Authorizing Provider  ACCU-CHEK FASTCLIX LANCETS MISC Use to check blood sugar 3 times a day. Dx code E11.9 03/08/17   Shela Leff, MD  amLODipine (NORVASC) 10 MG tablet Take 1 tablet (10 mg total) by mouth daily. 11/21/16   Shela Leff, MD  atorvastatin (LIPITOR) 40 MG tablet Take 1 tablet (40 mg total) by mouth daily. 11/21/16 11/21/17  Shela Leff, MD  Blood Glucose Monitoring Suppl (ACCU-CHEK AVIVA PLUS) w/Device KIT USE   TO CHECK GLUCOSE TWICE DAILY 10/11/16   Shela Leff, MD  gabapentin (NEURONTIN) 800 MG tablet TAKE 1 TABLET   3 (THREE) TIMES DAILY. 07/24/17   Shela Leff, MD  glucose blood (ACCU-CHEK GUIDE) test strip Use to check blood sugar 3 times a day. Dx code E11.9 03/23/17   Shela Leff, MD  Insulin Detemir (LEVEMIR FLEXPEN) 100 UNIT/ML Pen Inject 25 Units into the skin at bedtime. 01/30/17   Shela Leff, MD  Insulin Pen Needle (PEN NEEDLES) 31G X  5 MM MISC 1 each by Does not apply route daily. 03/08/17   Shela Leff, MD  Insulin Syringe-Needle U-100 31G X 15/64" 0.3 ML MISC Use to inject lantus insulin one time a day Patient not taking: Reported on 07/31/2017 02/21/17   Shela Leff, MD  Lancet Devices [facility]) lancets Use to test blood glucose 3 times daily. Dx: E11.49, insulin dependent 03/23/17   Shela Leff, MD  latanoprost (XALATAN) 0.005 % ophthalmic solution Place 1 drop into both eyes at bedtime.  06/18/17   [provider]  lisinopril (PRINIVIL,ZESTRIL) 20 MG tablet Take 1 tablet (20 mg total) by mouth daily. 08/05/17   Nche, Charlene Brooke, NP  metFORMIN (GLUCOPHAGE) 1000 MG tablet Take 1 tablet (1,000 mg total)  by mouth 2 (two) times daily with a meal. 11/21/16   Shela Leff, MD  nitrofurantoin, macrocrystal-monohydrate, (MACROBID) 100 MG capsule Take 1 capsule (100 mg total) by mouth 2 (two) times daily. 08/14/17   Nche, Charlene Brooke, NP  omeprazole (PRILOSEC) 20 MG capsule Take 1 capsule (20 mg total) by mouth daily. 07/31/17   Nche, Charlene Brooke, NP  ondansetron (ZOFRAN) 4 MG tablet Take 1 tablet (4 mg total) by mouth every 8 (eight) hours as needed for nausea or vomiting. 07/31/17   Nche, Charlene Brooke, NP  ranitidine (ZANTAC) 150 MG tablet Take 1 tablet (150 mg total) by mouth at bedtime. 07/31/17   Nche, Charlene Brooke, NP    Family History Family History  Problem Relation Age of Onset  . Heart disease Mother   . Diabetes Mother   . Depression Mother   . Cancer Father   . Diabetes Father   . Depression Father   . Colon cancer Father   . Diabetes Sister   . Kidney disease Brother   . Diabetes Brother   . Kidney disease Brother   . Diabetes Brother   . Diabetes Brother   . Diabetes Brother   . Colon cancer Brother   . Esophageal cancer Neg Hx   . Rectal cancer Neg Hx   . Stomach cancer Neg Hx   . Pancreatic cancer Neg Hx     Social History Social History   Tobacco Use  . Smoking status: Current Every Day Smoker    Packs/day: 1.00    Types: Cigarettes  . Smokeless tobacco: Never Used  . Tobacco comment: DOWN TO 3 CIGARETTES DAY  Substance Use Topics  . Alcohol use: Yes    Alcohol/week: 0.0 oz    Comment: 5 shots of whisky a day.   . Drug use: No     Allergies   Aspirin; Bupropion; Other; and Chocolate   Review of Systems Review of Systems  Constitutional:       Per HPI, otherwise negative  HENT:       Per HPI, otherwise negative  Respiratory:       Per HPI, otherwise negative  Cardiovascular:       Per HPI, otherwise negative  Gastrointestinal: Positive for nausea. Negative for vomiting.  Endocrine:       Negative aside from HPI  Genitourinary:        Neg aside from HPI   Musculoskeletal:       Per HPI, otherwise negative  Skin:       Describes concern of rash to RN, not to MD  Neurological: Positive for light-headedness. Negative for syncope.  Psychiatric/Behavioral: Negative for confusion.     Physical Exam Updated Vital Signs BP 117/74 (BP Location: Left  Arm)   Pulse 90   Temp 98.4 F (36.9 C) (Oral)   Resp 18   SpO2 97%   Physical Exam  Constitutional: She is oriented to person, place, and time. She appears well-developed and well-nourished. No distress.  HENT:  Head: Normocephalic and atraumatic.  Eyes: Conjunctivae and EOM are normal.  Cardiovascular: Normal rate and regular rhythm.  Pulmonary/Chest: Effort normal and breath sounds normal. No stridor. No respiratory distress.  Abdominal: She exhibits no distension.  Musculoskeletal: She exhibits no edema.  Neurological: She is alert and oriented to person, place, and time. No cranial nerve deficit.  Dysarthric speech, full phonation, but no slurring.  Skin: Skin is warm and dry.     Psychiatric: Her mood appears anxious. Thought content is paranoid.  Nursing note and vitals reviewed.    ED Treatments / Results  Labs (all labs ordered are listed, but only abnormal results are displayed) Labs Reviewed  COMPREHENSIVE METABOLIC PANEL - Abnormal; Notable for the following components:      Result Value   Glucose, Bld 135 (*)    BUN 21 (*)    Creatinine, Ser 1.25 (*)    GFR calc non Af Amer 43 (*)    GFR calc Af Amer 50 (*)    All other components within normal limits  CBC WITH DIFFERENTIAL/PLATELET - Abnormal; Notable for the following components:   HCT 35.0 (*)    All other components within normal limits  CBG MONITORING, ED - Abnormal; Notable for the following components:   Glucose-Capillary 149 (*)    All other components within normal limits  TROPONIN I    EKG  EKG Interpretation  Date/Time:  Tuesday August 21 2017 08:59:00 EST Ventricular  Rate:  82 PR Interval:    QRS Duration: 77 QT Interval:  383 QTC Calculation: 448 R Axis:   -34 Text Interpretation:  Sinus rhythm Left axis deviation Low voltage, precordial leads T wave abnormality Abnormal ekg Confirmed by Carmin Muskrat [phone]) on 08/21/2017 9:11:08 AM       Radiology Dg Chest 2 View  Result Date: 08/21/2017 CLINICAL DATA:  Cough, rash, feet swelling. History of hypertension, diabetes, asthma and seizures. EXAM: CHEST  2 VIEW COMPARISON:  None. FINDINGS: Normal cardiac silhouette and mediastinal contours. Atherosclerotic plaque within the thoracic aorta. No focal airspace opacities. No pleural effusion or pneumothorax. There is a minimal amount of pleuroparenchymal thickening about the right minor and bilateral major fissures. No evidence of edema. No acute osseus abnormalities. IMPRESSION: 1.  No acute cardiopulmonary disease. 2.  Aortic Atherosclerosis (ICD10-I70.0). Electronically Signed   By: Sandi Mariscal M.D.   On: 08/21/2017 10:31    Procedures Procedures (including critical care time)  Medications Ordered in ED Medications  0.9 %  sodium chloride infusion ( Intravenous New Bag/Given 08/21/17 0936)     Initial Impression / Assessment and Plan / ED Course  I have reviewed the triage vital signs and the nursing notes.  Pertinent labs & imaging results that were available during my care of the patient were reviewed by me and considered in my medical decision making (see chart for details).  12:12 PM On repeat exam patient is awake and alert. I had a lengthy conversation with her about all findings including generally reassuring results aside from mild dehydration. She not hypoglycemic, hyperglycemic, there is little evidence for bacteremia, sepsis, or other acute new processes. During this repeat evaluation the patient continued to voice paranoid thoughts, though she denied hallucination, or paranoia itself.  I offered to have her speak with 1 of her  psychiatrist for consideration of her paranoia as well as possible medication effects contributing to her mental state. Patient refuses this recommendation. She denies any thoughts of self-harm, and there is no indication for IVC. Patient's physiologic status is stable, vitals unremarkable, labs unremarkable. Advocated for initiating cream for her dermatologic lesions, consistent with yeast infection, but with excoriation. And given patient's nausea, generalized discomfort, she was provided antiemetics. Patient will follow up with primary care.   Final Clinical Impressions(s) / ED Diagnoses  Weakness Rash   Carmin Muskrat, MD 08/21/17 1213

## 2017-08-21 NOTE — Discharge Instructions (Signed)
As discussed, today's evaluation has been generally tedious or other medical issues. Please use the provided cream for your rash, twice daily. Please take all other medication as directed, but be sure to follow-up with your physician promptly to discuss today's presentation, and consider changes in her medication regimen to make you feel better.  Return here for concerning changes in your condition.

## 2017-08-21 NOTE — ED Notes (Signed)
Discharged patient at 1250. Patient refusing to get out of room and is putting on her makeup. Will escort patient out to lobby.

## 2017-08-21 NOTE — ED Notes (Signed)
Pt ambulated to the restroom with no assistance.

## 2017-08-25 ENCOUNTER — Emergency Department (HOSPITAL_COMMUNITY)
Admission: EM | Admit: 2017-08-25 | Discharge: 2017-08-26 | Disposition: A | Discharge status: Other Acute Inpatient Hospital | Payer: Medicare HMO | Attending: Emergency Medicine | Admitting: Emergency Medicine

## 2017-08-25 ENCOUNTER — Encounter (HOSPITAL_COMMUNITY): Payer: Self-pay | Admitting: Emergency Medicine

## 2017-08-25 DIAGNOSIS — R4587 Impulsiveness: Secondary | ICD-10-CM | POA: Diagnosis not present

## 2017-08-25 DIAGNOSIS — F0390 Unspecified dementia without behavioral disturbance: Secondary | ICD-10-CM | POA: Diagnosis not present

## 2017-08-25 DIAGNOSIS — Z79899 Other long term (current) drug therapy: Secondary | ICD-10-CM | POA: Insufficient documentation

## 2017-08-25 DIAGNOSIS — J45909 Unspecified asthma, uncomplicated: Secondary | ICD-10-CM | POA: Diagnosis not present

## 2017-08-25 DIAGNOSIS — F29 Unspecified psychosis not due to a substance or known physiological condition: Secondary | ICD-10-CM | POA: Diagnosis not present

## 2017-08-25 DIAGNOSIS — Z0000 Encounter for general adult medical examination without abnormal findings: Secondary | ICD-10-CM | POA: Diagnosis not present

## 2017-08-25 DIAGNOSIS — G4700 Insomnia, unspecified: Secondary | ICD-10-CM | POA: Diagnosis not present

## 2017-08-25 DIAGNOSIS — Z794 Long term (current) use of insulin: Secondary | ICD-10-CM | POA: Diagnosis not present

## 2017-08-25 DIAGNOSIS — Z049 Encounter for examination and observation for unspecified reason: Secondary | ICD-10-CM

## 2017-08-25 DIAGNOSIS — Z046 Encounter for general psychiatric examination, requested by authority: Secondary | ICD-10-CM | POA: Diagnosis not present

## 2017-08-25 DIAGNOSIS — I10 Essential (primary) hypertension: Secondary | ICD-10-CM | POA: Insufficient documentation

## 2017-08-25 DIAGNOSIS — F200 Paranoid schizophrenia: Secondary | ICD-10-CM | POA: Diagnosis not present

## 2017-08-25 DIAGNOSIS — F17210 Nicotine dependence, cigarettes, uncomplicated: Secondary | ICD-10-CM

## 2017-08-25 DIAGNOSIS — Z818 Family history of other mental and behavioral disorders: Secondary | ICD-10-CM | POA: Diagnosis not present

## 2017-08-25 DIAGNOSIS — F1020 Alcohol dependence, uncomplicated: Secondary | ICD-10-CM | POA: Diagnosis not present

## 2017-08-25 DIAGNOSIS — R462 Strange and inexplicable behavior: Secondary | ICD-10-CM | POA: Diagnosis not present

## 2017-08-25 DIAGNOSIS — F1010 Alcohol abuse, uncomplicated: Secondary | ICD-10-CM

## 2017-08-25 DIAGNOSIS — R443 Hallucinations, unspecified: Secondary | ICD-10-CM

## 2017-08-25 DIAGNOSIS — F250 Schizoaffective disorder, bipolar type: Secondary | ICD-10-CM | POA: Diagnosis not present

## 2017-08-25 DIAGNOSIS — Z008 Encounter for other general examination: Secondary | ICD-10-CM

## 2017-08-25 DIAGNOSIS — F209 Schizophrenia, unspecified: Secondary | ICD-10-CM | POA: Diagnosis not present

## 2017-08-25 DIAGNOSIS — R440 Auditory hallucinations: Secondary | ICD-10-CM | POA: Diagnosis not present

## 2017-08-25 DIAGNOSIS — F39 Unspecified mood [affective] disorder: Secondary | ICD-10-CM | POA: Diagnosis not present

## 2017-08-25 DIAGNOSIS — E1165 Type 2 diabetes mellitus with hyperglycemia: Secondary | ICD-10-CM | POA: Insufficient documentation

## 2017-08-25 DIAGNOSIS — Z720 Tobacco use: Secondary | ICD-10-CM

## 2017-08-25 LAB — CBC WITH DIFFERENTIAL/PLATELET
BASOS ABS: 0 10*3/uL (ref 0.0–0.1)
BASOS PCT: 0 %
Eosinophils Absolute: 0.2 10*3/uL (ref 0.0–0.7)
Eosinophils Relative: 3 %
HEMATOCRIT: 37.6 % (ref 36.0–46.0)
HEMOGLOBIN: 12.6 g/dL (ref 12.0–15.0)
LYMPHS PCT: 41 %
Lymphs Abs: 3.5 10*3/uL (ref 0.7–4.0)
MCH: 30.2 pg (ref 26.0–34.0)
MCHC: 33.5 g/dL (ref 30.0–36.0)
MCV: 90.2 fL (ref 78.0–100.0)
Monocytes Absolute: 0.5 10*3/uL (ref 0.1–1.0)
Monocytes Relative: 6 %
NEUTROS ABS: 4.3 10*3/uL (ref 1.7–7.7)
NEUTROS PCT: 50 %
Platelets: 269 10*3/uL (ref 150–400)
RBC: 4.17 MIL/uL (ref 3.87–5.11)
RDW: 13.3 % (ref 11.5–15.5)
WBC: 8.6 10*3/uL (ref 4.0–10.5)

## 2017-08-25 LAB — COMPREHENSIVE METABOLIC PANEL
ALBUMIN: 4.8 g/dL (ref 3.5–5.0)
ALK PHOS: 85 U/L (ref 38–126)
ALT: 29 U/L (ref 14–54)
AST: 28 U/L (ref 15–41)
Anion gap: 9 (ref 5–15)
BILIRUBIN TOTAL: 0.7 mg/dL (ref 0.3–1.2)
BUN: 11 mg/dL (ref 6–20)
CALCIUM: 9.7 mg/dL (ref 8.9–10.3)
CO2: 27 mmol/L (ref 22–32)
CREATININE: 0.97 mg/dL (ref 0.44–1.00)
Chloride: 102 mmol/L (ref 101–111)
GFR calc Af Amer: >60 mL/min (ref >60)
GFR calc non Af Amer: 59 mL/min — ABNORMAL LOW (ref >60)
GLUCOSE: 251 mg/dL — AB (ref 65–99)
POTASSIUM: 4 mmol/L (ref 3.5–5.1)
Sodium: 138 mmol/L (ref 135–145)
TOTAL PROTEIN: 7.7 g/dL (ref 6.5–8.1)

## 2017-08-25 LAB — CBG MONITORING, ED
GLUCOSE-CAPILLARY: 236 mg/dL — AB (ref 65–99)
GLUCOSE-CAPILLARY: 170 mg/dL — AB (ref 65–99)

## 2017-08-25 LAB — RAPID URINE DRUG SCREEN, HOSP PERFORMED
Amphetamines: NOT DETECTED
BARBITURATES: NOT DETECTED
Benzodiazepines: NOT DETECTED
Cocaine: NOT DETECTED
Opiates: NOT DETECTED
TETRAHYDROCANNABINOL: NOT DETECTED

## 2017-08-25 LAB — SALICYLATE LEVEL: Salicylate Lvl: <7 mg/dL (ref 2.8–30.0)

## 2017-08-25 LAB — ETHANOL: Alcohol, Ethyl (B): <10 mg/dL (ref <10)

## 2017-08-25 LAB — ACETAMINOPHEN LEVEL: Acetaminophen (Tylenol), Serum: <10 ug/mL — ABNORMAL LOW (ref 10–30)

## 2017-08-25 MED ORDER — FAMOTIDINE 20 MG PO TABS
20.0000 mg | ORAL_TABLET | Freq: Every day | ORAL | Status: DC
  Administered 2017-08-25 – 2017-08-26 (×2): 20 mg via ORAL
  Filled 2017-08-25 (×2): qty 1

## 2017-08-25 MED ORDER — ACETAMINOPHEN 325 MG PO TABS: 650.0000 mg | ORAL_TABLET | ORAL | Status: DC | PRN

## 2017-08-25 MED ORDER — NICOTINE 21 MG/24HR TD PT24
21.0000 mg | MEDICATED_PATCH | Freq: Every day | TRANSDERMAL | Status: DC
  Administered 2017-08-25: 21 mg via TRANSDERMAL
  Filled 2017-08-25 (×2): qty 1

## 2017-08-25 MED ORDER — ATORVASTATIN CALCIUM 40 MG PO TABS: 40.0000 mg | ORAL_TABLET | Freq: Every day | ORAL | Status: DC

## 2017-08-25 MED ORDER — PANTOPRAZOLE SODIUM 40 MG PO TBEC
40.0000 mg | DELAYED_RELEASE_TABLET | Freq: Every day | ORAL | Status: DC
  Administered 2017-08-25 – 2017-08-26 (×2): 40 mg via ORAL
  Filled 2017-08-25 (×2): qty 1

## 2017-08-25 MED ORDER — INSULIN DETEMIR 100 UNIT/ML FLEXPEN: 25.0000 [IU] | PEN_INJECTOR | Freq: Every day | SUBCUTANEOUS | Status: DC

## 2017-08-25 MED ORDER — AMLODIPINE BESYLATE 5 MG PO TABS
10.0000 mg | ORAL_TABLET | Freq: Every day | ORAL | Status: DC
  Administered 2017-08-26: 10 mg via ORAL

## 2017-08-25 MED ORDER — LISINOPRIL 20 MG PO TABS: 20.0000 mg | ORAL_TABLET | Freq: Every day | ORAL | Status: DC

## 2017-08-25 MED ORDER — METFORMIN HCL 500 MG PO TABS
1000.0000 mg | ORAL_TABLET | Freq: Two times a day (BID) | ORAL | Status: DC
  Administered 2017-08-25 – 2017-08-26 (×3): 1000 mg via ORAL
  Filled 2017-08-25 (×3): qty 2

## 2017-08-25 MED ORDER — GABAPENTIN 400 MG PO CAPS
800.0000 mg | ORAL_CAPSULE | Freq: Three times a day (TID) | ORAL | Status: DC
  Administered 2017-08-25 – 2017-08-26 (×3): 800 mg via ORAL
  Filled 2017-08-25 (×3): qty 2

## 2017-08-25 MED ORDER — ONDANSETRON HCL 4 MG PO TABS: 4.0000 mg | ORAL_TABLET | Freq: Three times a day (TID) | ORAL | Status: DC | PRN

## 2017-08-25 MED ORDER — ZOLPIDEM TARTRATE 5 MG PO TABS: 5.0000 mg | ORAL_TABLET | Freq: Every evening | ORAL | Status: DC | PRN

## 2017-08-25 MED ORDER — DIVALPROEX SODIUM 250 MG PO DR TAB
250.0000 mg | DELAYED_RELEASE_TABLET | Freq: Two times a day (BID) | ORAL | Status: DC
  Administered 2017-08-25 – 2017-08-26 (×2): 250 mg via ORAL
  Filled 2017-08-25 (×2): qty 1

## 2017-08-25 MED ORDER — ALUM & MAG HYDROXIDE-SIMETH 200-200-20 MG/5ML PO SUSP: 30.0000 mL | Freq: Four times a day (QID) | ORAL | Status: DC | PRN

## 2017-08-25 MED ORDER — RISPERIDONE 0.5 MG PO TABS
0.5000 mg | ORAL_TABLET | Freq: Two times a day (BID) | ORAL | Status: DC
  Administered 2017-08-25 – 2017-08-26 (×2): 0.5 mg via ORAL
  Filled 2017-08-25 (×2): qty 1

## 2017-08-25 MED ORDER — GABAPENTIN 800 MG PO TABS
800.0000 mg | ORAL_TABLET | Freq: Three times a day (TID) | ORAL | Status: DC
  Administered 2017-08-25: 800 mg via ORAL
  Filled 2017-08-25 (×3): qty 1

## 2017-08-25 NOTE — ED Notes (Signed)
Bed: BR49 Expected date:  Expected time:  Means of arrival:  Comments: 66 IVC

## 2017-08-25 NOTE — Consult Note (Signed)
[facility] Consult    Reason for Consult:  Delusions, noncompliant with medications Referring Physician:  EDP Patient Identification: [name] MRN:  [id number] Principal Diagnosis: Schizophrenia, paranoid type [facility]) Diagnosis:   Patient Active Problem List   Diagnosis Date Noted  . Schizophrenia, paranoid type ([place]) [F20.0] 08/25/2017    Priority: High  . Hypoglycemia [E16.2] 06/19/2017  . Diverticulosis [K57.90] 05/08/2017  . Hyperlipidemia [E78.5] 08/25/2016  . Obesity [E66.9] 06/20/2016  . Health care maintenance [Z00.00] 05/17/2016  . Skin rash [R21] 05/17/2016  . Diabetic peripheral neuropathy associated with type 2 diabetes mellitus ([place]) [E11.42] 05/17/2016  . GERD (gastroesophageal reflux disease) [K21.9] 05/17/2016  . Tobacco use [Z72.0] 05/17/2016  . Back pain of thoracolumbar region [M54.5, M54.6] 11/08/2015  . DM type 2 (diabetes mellitus, type 2) ([place]) [E11.9] 05/03/2012  . Hypertension [I10] 05/03/2012  . Anxiety [F41.9] 05/03/2012    Total Time spent with patient: 45 minutes  Subjective:   [name] is a 66 y.o. female patient admitted with psychosis.  HPI:  66 yo female with a long history of schizophrenia, most recently at [facility] in May.  Her daughter IVC'd her for delusional behaviors and auditory hallucinations telling her to walk the streets naked, which she did.  Denies suicidal/homicidal ideations and substance abuse.  Toxicology negative.  Irritable on assessment but not aggressive.    Past Psychiatric History: schizophrenia  Risk to Self: Is patient at risk for suicide?: No Risk to Others:  No Prior Inpatient Therapy:  Multiple Prior Outpatient Therapy:  Yes  Past Medical History:  Past Medical History:  Diagnosis Date  . Allergy   . Anxiety   . Arthritis   . Asthma   . Cataract   . Depression   . Diabetes mellitus    type II  . GERD (gastroesophageal reflux disease)   . Hyperlipidemia   . Hypertension   .  Neuromuscular disorder ([place])   . Seizures ([place])    last seizure Jan 2016    Past Surgical History:  Procedure Laterality Date  . APPENDECTOMY    . CYSTECTOMY    . FOOT SURGERY Bilateral 2001   x3  . SALPINGOOPHORECTOMY Left   . TUBAL LIGATION     Family History:  Family History  Problem Relation Age of Onset  . Heart disease Mother   . Diabetes Mother   . Depression Mother   . Cancer Father   . Diabetes Father   . Depression Father   . Colon cancer Father   . Diabetes Sister   . Kidney disease Brother   . Diabetes Brother   . Kidney disease Brother   . Diabetes Brother   . Diabetes Brother   . Diabetes Brother   . Colon cancer Brother   . Esophageal cancer Neg Hx   . Rectal cancer Neg Hx   . Stomach cancer Neg Hx   . Pancreatic cancer Neg Hx    Family Psychiatric  History: none Social History:  Social History   Substance and Sexual Activity  Alcohol Use Yes  . Alcohol/week: 0.0 oz   Comment: 5 shots of whisky a day.      Social History   Substance and Sexual Activity  Drug Use No    Social History   Socioeconomic History  . Marital status: Single    Spouse name: None  . Number of children: None  . Years of education: None  . Highest education level: None  Social Needs  . Emergency planning/management officer  strain: None  . Food insecurity - worry: None  . Food insecurity - inability: None  . Transportation needs - medical: None  . Transportation needs - non-medical: None  Occupational History  . None  Tobacco Use  . Smoking status: Current Every Day Smoker    Packs/day: 1.00    Types: Cigarettes  . Smokeless tobacco: Never Used  . Tobacco comment: DOWN TO 3 CIGARETTES DAY  Substance and Sexual Activity  . Alcohol use: Yes    Alcohol/week: 0.0 oz    Comment: 5 shots of whisky a day.   . Drug use: No  . Sexual activity: Not Currently    Partners: Male    Birth control/protection: Post-menopausal  Other Topics Concern  . None  Social History Narrative  .  None   Additional Social History:    Allergies:   Allergies  Allergen Reactions  . Aspirin Anaphylaxis    Swelling in mouth, throat and hives  . Bupropion     Pt reports "skin burning" sensation.   . Other Itching and Swelling    Antibiotic (name unknown).  Occurred three years ago with prescription antibiotic.  Marland Kitchen Chocolate Hives and Rash    Labs:  Results for orders placed or performed during the hospital encounter of 08/25/17 (from the past 48 hour(s))  Rapid urine drug screen (hospital performed)     Status: None   Collection Time: 08/25/17  2:35 PM  Result Value Ref Range   Opiates NONE DETECTED NONE DETECTED   Cocaine NONE DETECTED NONE DETECTED   Benzodiazepines NONE DETECTED NONE DETECTED   Amphetamines NONE DETECTED NONE DETECTED   Tetrahydrocannabinol NONE DETECTED NONE DETECTED   Barbiturates NONE DETECTED NONE DETECTED    Comment:        DRUG SCREEN FOR MEDICAL PURPOSES ONLY.  IF CONFIRMATION IS NEEDED FOR ANY PURPOSE, NOTIFY LAB WITHIN 5 DAYS.        LOWEST DETECTABLE LIMITS FOR URINE DRUG SCREEN Drug Class       Cutoff (ng/mL) Amphetamine      1000 Barbiturate      200 Benzodiazepine   932 Tricyclics       355 Opiates          300 Cocaine          300 THC              50   CBC w/diff     Status: None   Collection Time: 08/25/17  2:58 PM  Result Value Ref Range   WBC 8.6 4.0 - 10.5 K/uL   RBC 4.17 3.87 - 5.11 MIL/uL   Hemoglobin 12.6 12.0 - 15.0 g/dL   HCT 37.6 36.0 - 46.0 %   MCV 90.2 78.0 - 100.0 fL   MCH 30.2 26.0 - 34.0 pg   MCHC 33.5 30.0 - 36.0 g/dL   RDW 13.3 11.5 - 15.5 %   Platelets 269 150 - 400 K/uL   Neutrophils Relative % 50 %   Neutro Abs 4.3 1.7 - 7.7 K/uL   Lymphocytes Relative 41 %   Lymphs Abs 3.5 0.7 - 4.0 K/uL   Monocytes Relative 6 %   Monocytes Absolute 0.5 0.1 - 1.0 K/uL   Eosinophils Relative 3 %   Eosinophils Absolute 0.2 0.0 - 0.7 K/uL   Basophils Relative 0 %   Basophils Absolute 0.0 0.0 - 0.1 K/uL   Comprehensive metabolic panel     Status: Abnormal   Collection Time: 08/25/17  2:58 PM  Result Value Ref Range   Sodium 138 135 - 145 mmol/L   Potassium 4.0 3.5 - 5.1 mmol/L   Chloride 102 101 - 111 mmol/L   CO2 27 22 - 32 mmol/L   Glucose, Bld 251 (H) 65 - 99 mg/dL   BUN 11 6 - 20 mg/dL   Creatinine, Ser 0.97 0.44 - 1.00 mg/dL   Calcium 9.7 8.9 - 10.3 mg/dL   Total Protein 7.7 6.5 - 8.1 g/dL   Albumin 4.8 3.5 - 5.0 g/dL   AST 28 15 - 41 U/L   ALT 29 14 - 54 U/L   Alkaline Phosphatase 85 38 - 126 U/L   Total Bilirubin 0.7 0.3 - 1.2 mg/dL   GFR calc non Af Amer 59 (L) >60 mL/min   GFR calc Af Amer >60 >60 mL/min    Comment: (NOTE) The eGFR has been calculated using the CKD EPI equation. This calculation has not been validated in all clinical situations. eGFR's persistently <60 mL/min signify possible Chronic Kidney Disease.    Anion gap 9 5 - 15  Ethanol     Status: None   Collection Time: 08/25/17  2:58 PM  Result Value Ref Range   Alcohol, Ethyl (B) <10 <10 mg/dL    Comment:        LOWEST DETECTABLE LIMIT FOR SERUM ALCOHOL IS 10 mg/dL FOR MEDICAL PURPOSES ONLY   Salicylate level     Status: None   Collection Time: 08/25/17  2:58 PM  Result Value Ref Range   Salicylate Lvl <6.3 2.8 - 30.0 mg/dL  Acetaminophen level     Status: Abnormal   Collection Time: 08/25/17  2:58 PM  Result Value Ref Range   Acetaminophen (Tylenol), Serum <10 (L) 10 - 30 ug/mL    Comment:        THERAPEUTIC CONCENTRATIONS VARY SIGNIFICANTLY. A RANGE OF 10-30 ug/mL MAY BE AN EFFECTIVE CONCENTRATION FOR MANY PATIENTS. HOWEVER, SOME ARE BEST TREATED AT CONCENTRATIONS OUTSIDE THIS RANGE. ACETAMINOPHEN CONCENTRATIONS >150 ug/mL AT 4 HOURS AFTER INGESTION AND >50 ug/mL AT 12 HOURS AFTER INGESTION ARE OFTEN ASSOCIATED WITH TOXIC REACTIONS.   CBG monitoring, ED     Status: Abnormal   Collection Time: 08/25/17  3:15 PM  Result Value Ref Range   Glucose-Capillary 236 (H) 65 - 99 mg/dL     Current Facility-Administered Medications  Medication Dose Route Frequency Provider Last Rate Last Dose  . 0.9 %  sodium chloride infusion  500 mL Intravenous Continuous Nelida Meuse III, MD      . acetaminophen (TYLENOL) tablet 650 mg  650 mg Oral Q4H PRN [address], [place], [place]      . alum & mag hydroxide-simeth (MAALOX/MYLANTA) 200-200-20 MG/5ML suspension 30 mL  30 mL Oral Q6H PRN Street, [place], [place]      . [START ON 08/26/2017] amLODipine (NORVASC) tablet 10 mg  10 mg Oral Daily [address], [place], [place]      . atorvastatin (LIPITOR) tablet 40 mg  40 mg Oral Daily [address], [place], [place]      . divalproex (DEPAKOTE) DR tablet 250 mg  250 mg Oral Q12H Patrecia Pour, NP      . famotidine (PEPCID) tablet 20 mg  20 mg Oral Daily [address], [place], [place]      . gabapentin (NEURONTIN) tablet 800 mg  800 mg Oral TID [address], [place], [place]      . Insulin Detemir (LEVEMIR) FlexPen 25 Units  25 Units Subcutaneous [address], [place], [place]      .  lisinopril (PRINIVIL,ZESTRIL) tablet 20 mg  20 mg Oral Daily [address], [place], [place]      . metFORMIN (GLUCOPHAGE) tablet 1,000 mg  1,000 mg Oral BID WC [address], [place], [place]      . nicotine (NICODERM CQ - dosed in mg/24 hours) patch 21 mg  21 mg Transdermal Daily [address], [place], PA-C      . ondansetron (ZOFRAN) tablet 4 mg  4 mg Oral Q8H PRN Street, [place], PA-C      . pantoprazole (PROTONIX) EC tablet 40 mg  40 mg Oral Daily [address], [place], PA-C      . risperiDONE (RISPERDAL) tablet 0.5 mg  0.5 mg Oral BID Patrecia Pour, NP       Current Outpatient Medications  Medication Sig Dispense Refill  . ACCU-CHEK FASTCLIX LANCETS MISC Use to check blood sugar 3 times a day. Dx code E11.9 102 each 12  . amLODipine (NORVASC) 10 MG tablet Take 1 tablet (10 mg total) by mouth daily. (Patient not taking: Reported on 08/21/2017) 90 tablet 3  . atorvastatin (LIPITOR) 40 MG tablet Take 1 tablet (40 mg total) by mouth daily. (Patient not taking: Reported on  08/21/2017) 90 tablet 3  . Blood Glucose Monitoring Suppl (ACCU-CHEK AVIVA PLUS) w/Device KIT USE   TO CHECK GLUCOSE TWICE DAILY 1 kit 0  . gabapentin (NEURONTIN) 800 MG tablet TAKE 1 TABLET   3 (THREE) TIMES DAILY. 270 tablet 1  . glucose blood (ACCU-CHEK GUIDE) test strip Use to check blood sugar 3 times a day. Dx code E11.9 100 each 12  . Insulin Detemir (LEVEMIR FLEXPEN) 100 UNIT/ML Pen Inject 25 Units into the skin at bedtime. (Patient not taking: Reported on 08/21/2017) 45 mL 3  . Insulin Pen Needle (PEN NEEDLES) 31G X 5 MM MISC 1 each by Does not apply route daily. 100 each 4  . Insulin Syringe-Needle U-100 31G X 15/64" 0.3 ML MISC Use to inject lantus insulin one time a day (Patient not taking: Reported on 07/31/2017) 100 each 11  . Lancet Devices (ACCU-CHEK SOFTCLIX) lancets Use to test blood glucose 3 times daily. Dx: E11.49, insulin dependent 300 each 3  . lisinopril (PRINIVIL,ZESTRIL) 20 MG tablet Take 1 tablet (20 mg total) by mouth daily. (Patient not taking: Reported on 08/21/2017) 30 tablet 2  . metFORMIN (GLUCOPHAGE) 1000 MG tablet Take 1 tablet (1,000 mg total) by mouth 2 (two) times daily with a meal. 180 tablet 3  . nitrofurantoin, macrocrystal-monohydrate, (MACROBID) 100 MG capsule Take 1 capsule (100 mg total) by mouth 2 (two) times daily. (Patient not taking: Reported on 08/21/2017) 20 capsule 0  . omeprazole (PRILOSEC) 20 MG capsule Take 1 capsule (20 mg total) by mouth daily. (Patient not taking: Reported on 08/21/2017) 30 capsule 0  . ondansetron (ZOFRAN ODT) 4 MG disintegrating tablet Take 1 tablet (4 mg total) by mouth every 8 (eight) hours as needed for nausea or vomiting. 20 tablet 0  . ondansetron (ZOFRAN) 4 MG tablet Take 1 tablet (4 mg total) by mouth every 8 (eight) hours as needed for nausea or vomiting. (Patient not taking: Reported on 08/21/2017) 20 tablet 0  . ranitidine (ZANTAC) 150 MG tablet Take 1 tablet (150 mg total) by mouth at bedtime. (Patient not taking:  Reported on 08/21/2017) 30 tablet 0    Musculoskeletal: Strength & Muscle Tone: within normal limits Gait & Station: normal Patient leans: N/A  Psychiatric Specialty Exam: Physical Exam  Constitutional: She is oriented to person, place, and time. She appears well-developed  and well-nourished.  HENT:  Head: Normocephalic.  Neck: Normal range of motion.  Respiratory: Effort normal.  Musculoskeletal: Normal range of motion.  Neurological: She is alert and oriented to person, place, and time.  Psychiatric: Her speech is normal and behavior is normal. Her affect is angry and labile. Thought content is paranoid and delusional. Cognition and memory are impaired. She expresses inappropriate judgment.    Review of Systems  All other systems reviewed and are negative.   Blood pressure 116/66, pulse 96, temperature 98 F (36.7 C), temperature source Oral, resp. rate 17, SpO2 100 %.There is no height or weight on file to calculate BMI.  General Appearance: Casual  Eye Contact:  Fair  Speech:  Normal Rate  Volume:  Normal  Mood:  Irritable  Affect:  Congruent  Thought Process:  Coherent and Descriptions of Associations: Intact  Orientation:  Full (Time, Place, and Person)  Thought Content:  Delusions and Hallucinations: Auditory  Suicidal Thoughts:  No  Homicidal Thoughts:  No  Memory:  Immediate;   Fair Recent;   Fair Remote;   Fair  Judgement:  Impaired  Insight:  Lacking  Psychomotor Activity:  Increased  Concentration:  Concentration: Fair and Attention Span: Fair  Recall:  [employer] of Knowledge:  Fair  Language:  Good  Akathisia:  No  Handed:  Right  AIMS (if indicated):     Assets:  Housing Leisure Time Physical Health Resilience Social Support  ADL's:  Intact  Cognition:  WNL  Sleep:        Treatment Plan Summary: Daily contact with patient to assess and evaluate symptoms and progress in treatment, Medication management and Plan schizophrenia, paranoid type:   -Crisis stabilization -Medication management:  Restarted medical medications, started Risperdal 0.5 mg BID for irritability and psychosis along with Depakote 250 mg BID for mood stabilization -Individual counseling  Disposition: Recommend psychiatric Inpatient admission when medically cleared.  Waylan Boga, NP 08/25/2017 4:24 PM  Patient seen face-to-face for psychiatric evaluation, chart reviewed and case discussed with the physician extender and developed treatment plan. Reviewed the information documented and agree with the treatment plan. Corena Pilgrim, MD

## 2017-08-25 NOTE — ED Notes (Signed)
Pt stated to sitter "Can I get some towels to go between my legs?  I don't get up to go to the bathroom at night."  Pt informed will need to get up to bathroom.

## 2017-08-25 NOTE — ED Notes (Signed)
Pt OOB and ambulated to BR without assistance.

## 2017-08-25 NOTE — ED Notes (Signed)
Patients states that she does not want to talk to anyone that is not a doctor.

## 2017-08-25 NOTE — ED Triage Notes (Addendum)
Patient brought in by [facility] IVC'd by daughter. Hx of bipolar schizophrenia and dementia. Reports that patient has been off meds x1 year. Visual/auditory hallucinations. States that patient has been walking up and down street naked, stating that "the universe" is telling her to do it. Denies SI/HI

## 2017-08-25 NOTE — ED Provider Notes (Signed)
[place] DEPT Provider Note   CSN: [id number] Arrival date & time: 08/25/17  1345     History   Chief Complaint Chief Complaint  Patient presents with  . Medical Clearance    IVC'd    HPI [name] is a 66 y.o. female with a PMHx of schizophrenia, bipolar disorder, anxiety, depression, DM2, GERD, HTN, HLD, seizures, and other conditions listed below, who presents to the ED via [facility] under IVC. LEVEL 5 CAVEAT DUE TO PSYCHIATRIC CONDITION, history obtained from IVC paperwork.  Per IVC, "a danger to self, to wit: Diagnosed with bipolar schizophrenia and dementia and has been off her meds for most of the year; talks about the universe and how it is telling her to do things like walk up and down [employer] wearing only a Christmas Scarf, knee high stockings and shoes (nude from waist down), this occurred on 08/23/17; drinks alcohol excessively."  Patient does not participate in questioning, states "I cannot do anything for you I verify facts, you tell me why I am here", and eats a sandwich.  She admits to drinking whiskey however she will not disclose how often or how much she drinks.  She states that she smokes cigarettes.  She denies SI or HI, denies AVH, but it is unclear whether she is being truthful and honest.  She denies illicit drug use.  Remainder of history is limited due to psychiatric condition.   The history is provided by the patient and medical records. No language interpreter was used.  Mental Health Problem  Presenting symptoms: bizarre behavior and hallucinations   Patient accompanied by:  Law enforcement Degree of incapacity (severity):  Unable to specify Onset quality:  Unable to specify Timing:  Unable to specify Progression:  Unable to specify Chronicity:  Recurrent Treatment compliance:  Unable to specify Risk factors: hx of mental illness     Past Medical History:  Diagnosis Date  . Allergy   . Anxiety   . Arthritis   .  Asthma   . Cataract   . Depression   . Diabetes mellitus    type II  . GERD (gastroesophageal reflux disease)   . Hyperlipidemia   . Hypertension   . Neuromuscular disorder ([place])   . Seizures [facility])    last seizure Jan 2016    Patient Active Problem List   Diagnosis Date Noted  . Hypoglycemia 06/19/2017  . Diverticulosis 05/08/2017  . Psychiatric illness 01/12/2017  . Hyperlipidemia 08/25/2016  . Obesity 06/20/2016  . Health care maintenance 05/17/2016  . Skin rash 05/17/2016  . Diabetic peripheral neuropathy associated with type 2 diabetes mellitus ([place]) 05/17/2016  . GERD (gastroesophageal reflux disease) 05/17/2016  . Tobacco use 05/17/2016  . Back pain of thoracolumbar region 11/08/2015  . DM type 2 (diabetes mellitus, type 2) ([place]) 05/03/2012  . Hypertension 05/03/2012  . Anxiety 05/03/2012    Past Surgical History:  Procedure Laterality Date  . APPENDECTOMY    . CYSTECTOMY    . FOOT SURGERY Bilateral 2001   x3  . SALPINGOOPHORECTOMY Left   . TUBAL LIGATION      OB History    Gravida Para Term Preterm AB Living   _0 SAB TAB Ectopic Multiple Live Births   2       2       Home Medications    Prior to Admission medications   Medication Sig Start Date End Date Taking? Authorizing  Provider  ACCU-CHEK FASTCLIX LANCETS MISC Use to check blood sugar 3 times a day. Dx code E11.9 03/08/17   Shela Leff, MD  amLODipine (NORVASC) 10 MG tablet Take 1 tablet (10 mg total) by mouth daily. Patient not taking: Reported on 08/21/2017 11/21/16   Shela Leff, MD  atorvastatin (LIPITOR) 40 MG tablet Take 1 tablet (40 mg total) by mouth daily. Patient not taking: Reported on 08/21/2017 11/21/16 11/21/17  Shela Leff, MD  Blood Glucose Monitoring Suppl (ACCU-CHEK AVIVA PLUS) w/Device KIT USE   TO CHECK GLUCOSE TWICE DAILY 10/11/16   Shela Leff, MD  gabapentin (NEURONTIN) 800 MG tablet TAKE 1 TABLET   3 (THREE) TIMES DAILY. 07/24/17    Shela Leff, MD  glucose blood (ACCU-CHEK GUIDE) test strip Use to check blood sugar 3 times a day. Dx code E11.9 03/23/17   Shela Leff, MD  Insulin Detemir (LEVEMIR FLEXPEN) 100 UNIT/ML Pen Inject 25 Units into the skin at bedtime. Patient not taking: Reported on 08/21/2017 01/30/17   Shela Leff, MD  Insulin Pen Needle (PEN NEEDLES) 31G X 5 MM MISC 1 each by Does not apply route daily. 03/08/17   Shela Leff, MD  Insulin Syringe-Needle U-100 31G X 15/64" 0.3 ML MISC Use to inject lantus insulin one time a day Patient not taking: Reported on 07/31/2017 02/21/17   Shela Leff, MD  Lancet Devices [facility]) lancets Use to test blood glucose 3 times daily. Dx: E11.49, insulin dependent 03/23/17   Shela Leff, MD  lisinopril (PRINIVIL,ZESTRIL) 20 MG tablet Take 1 tablet (20 mg total) by mouth daily. Patient not taking: Reported on 08/21/2017 08/05/17   Nche, Charlene Brooke, NP  metFORMIN (GLUCOPHAGE) 1000 MG tablet Take 1 tablet (1,000 mg total) by mouth 2 (two) times daily with a meal. 11/21/16   Shela Leff, MD  nitrofurantoin, macrocrystal-monohydrate, (MACROBID) 100 MG capsule Take 1 capsule (100 mg total) by mouth 2 (two) times daily. Patient not taking: Reported on 08/21/2017 08/14/17   Nche, Charlene Brooke, NP  omeprazole (PRILOSEC) 20 MG capsule Take 1 capsule (20 mg total) by mouth daily. Patient not taking: Reported on 08/21/2017 07/31/17   Nche, Charlene Brooke, NP  ondansetron (ZOFRAN ODT) 4 MG disintegrating tablet Take 1 tablet (4 mg total) by mouth every 8 (eight) hours as needed for nausea or vomiting. 08/21/17   Carmin Muskrat, MD  ondansetron (ZOFRAN) 4 MG tablet Take 1 tablet (4 mg total) by mouth every 8 (eight) hours as needed for nausea or vomiting. Patient not taking: Reported on 08/21/2017 07/31/17   Nche, Charlene Brooke, NP  ranitidine (ZANTAC) 150 MG tablet Take 1 tablet (150 mg total) by mouth at bedtime. Patient not taking:  Reported on 08/21/2017 07/31/17   Nche, Charlene Brooke, NP    Family History Family History  Problem Relation Age of Onset  . Heart disease Mother   . Diabetes Mother   . Depression Mother   . Cancer Father   . Diabetes Father   . Depression Father   . Colon cancer Father   . Diabetes Sister   . Kidney disease Brother   . Diabetes Brother   . Kidney disease Brother   . Diabetes Brother   . Diabetes Brother   . Diabetes Brother   . Colon cancer Brother   . Esophageal cancer Neg Hx   . Rectal cancer Neg Hx   . Stomach cancer Neg Hx   . Pancreatic cancer Neg Hx     Social History Social History  Tobacco Use  . Smoking status: Current Every Day Smoker    Packs/day: 1.00    Types: Cigarettes  . Smokeless tobacco: Never Used  . Tobacco comment: DOWN TO 3 CIGARETTES DAY  Substance Use Topics  . Alcohol use: Yes    Alcohol/week: 0.0 oz    Comment: 5 shots of whisky a day.   . Drug use: No     Allergies   Aspirin; Bupropion; Other; and Chocolate   Review of Systems Review of Systems  Unable to perform ROS: Psychiatric disorder  Psychiatric/Behavioral: Positive for hallucinations.   LEVEL 5 CAVEAT DUE TO PSYCHIATRIC CONDITION  Physical Exam Updated Vital Signs BP 116/66 (BP Location: Right Arm)   Pulse 96   Temp 98 F (36.7 C) (Oral)   Resp 17   SpO2 100%   Physical Exam  Constitutional: She is oriented to person, place, and time. Vital signs are normal. She appears well-developed and well-nourished.  Non-toxic appearance. No distress.  Afebrile, nontoxic, NAD, standing at bedside eating a sandwich; agitated at times during questioning; refusing to lay down for evaluation  HENT:  Head: Normocephalic and atraumatic.  Mouth/Throat: Oropharynx is clear and moist and mucous membranes are normal.  Eyes: Conjunctivae and EOM are normal. Right eye exhibits no discharge. Left eye exhibits no discharge.  Neck: Normal range of motion. Neck supple.  Cardiovascular:  Normal rate, regular rhythm, normal heart sounds and intact distal pulses. Exam reveals no gallop and no friction rub.  No murmur heard. Pulmonary/Chest: Effort normal and breath sounds normal. No respiratory distress. She has no decreased breath sounds. She has no wheezes. She has no rhonchi. She has no rales.  Abdominal: Soft. Normal appearance and bowel sounds are normal. She exhibits no distension. There is no tenderness. There is no rigidity, no rebound, no guarding, no CVA tenderness, no tenderness at McBurney's point and negative Murphy's sign.  Musculoskeletal: Normal range of motion.  Neurological: She is alert and oriented to person, place, and time. She has normal strength. No sensory deficit.  Skin: Skin is warm, dry and intact. No rash noted.  Psychiatric: Her affect is angry and labile. Her speech is rapid and/or pressured and tangential. She is not actively hallucinating. She expresses no homicidal and no suicidal ideation. She expresses no suicidal plans and no homicidal plans.  Labile and angry mood/affect, refuses to lay down for evaluation, standing eating a sandwich. Flight of ideas, tangential speech.  Denies SI, HI, or AVH, hard to tell whether she is responding to internal stimuli because she just continues to have flight of ideas throughout the visit  Nursing note and vitals reviewed.    ED Treatments / Results  Labs (all labs ordered are listed, but only abnormal results are displayed) Labs Reviewed  COMPREHENSIVE METABOLIC PANEL - Abnormal; Notable for the following components:      Result Value   Glucose, Bld 251 (*)    GFR calc non Af Amer 59 (*)    All other components within normal limits  ACETAMINOPHEN LEVEL - Abnormal; Notable for the following components:   Acetaminophen (Tylenol), Serum <10 (*)    All other components within normal limits  CBG MONITORING, ED - Abnormal; Notable for the following components:   Glucose-Capillary 236 (*)    All other  components within normal limits  RAPID URINE DRUG SCREEN, HOSP PERFORMED  CBC WITH DIFFERENTIAL/PLATELET  ETHANOL  SALICYLATE LEVEL    EKG  EKG Interpretation None       Radiology  No results found.  Procedures Procedures (including critical care time)  Medications Ordered in ED Medications  acetaminophen (TYLENOL) tablet 650 mg (not administered)  zolpidem (AMBIEN) tablet 5 mg (not administered)  ondansetron (ZOFRAN) tablet 4 mg (not administered)  alum & mag hydroxide-simeth (MAALOX/MYLANTA) 200-200-20 MG/5ML suspension 30 mL (not administered)  nicotine (NICODERM CQ - dosed in mg/24 hours) patch 21 mg (not administered)  amLODipine (NORVASC) tablet 10 mg (not administered)  atorvastatin (LIPITOR) tablet 40 mg (not administered)  gabapentin (NEURONTIN) tablet 800 mg (not administered)  Insulin Detemir (LEVEMIR) FlexPen 25 Units (not administered)  lisinopril (PRINIVIL,ZESTRIL) tablet 20 mg (not administered)  metFORMIN (GLUCOPHAGE) tablet 1,000 mg (not administered)  pantoprazole (PROTONIX) EC tablet 40 mg (not administered)  famotidine (PEPCID) tablet 20 mg (not administered)     Initial Impression / Assessment and Plan / ED Course  I have reviewed the triage vital signs and the nursing notes.  Pertinent labs & imaging results that were available during my care of the patient were reviewed by me and considered in my medical decision making (see chart for details).     66 y.o. female here with bizarre behavior, IVC'd; level 5 caveat applies due to pt being tangential and not answering questions, somewhat agitated during questioning, standing up eating a sandwich and refuses to lay down for evaluation. She admits to drinking whiskey but won't tell me when or how much. Denies SI/HI/AVH however hard to tell whether she's being truthful because she just continues to not directly answer questions and has flight of ideas. Will get clearance labs and reassess.   3:58 PM CBC  w/diff WNL. CMP with gluc 251 but otherwise WNL. EtOH level undetectable. Salicylate and acetaminophen levels WNL. UDS neg. Pt medically cleared at this time. Psych hold orders and home med orders placed. Please see TTS notes for further documentation of care/dispo. Pt stable at time of med clearance.     Final Clinical Impressions(s) / ED Diagnoses   Final diagnoses:  Bizarre behavior  Medical clearance for psychiatric admission  Involuntary commitment  Alcohol abuse  Tobacco use  Type 2 diabetes mellitus with hyperglycemia, with long-term current use of insulin [facility])    ED Discharge Orders    [address], [place], [place] 08/25/17 [place], Erin, MD 08/27/17 [phone]

## 2017-08-26 ENCOUNTER — Emergency Department (HOSPITAL_COMMUNITY): Payer: Medicare HMO

## 2017-08-26 DIAGNOSIS — Z794 Long term (current) use of insulin: Secondary | ICD-10-CM | POA: Diagnosis not present

## 2017-08-26 DIAGNOSIS — R462 Strange and inexplicable behavior: Secondary | ICD-10-CM | POA: Diagnosis not present

## 2017-08-26 DIAGNOSIS — F200 Paranoid schizophrenia: Secondary | ICD-10-CM | POA: Diagnosis not present

## 2017-08-26 DIAGNOSIS — F201 Disorganized schizophrenia: Secondary | ICD-10-CM | POA: Diagnosis not present

## 2017-08-26 DIAGNOSIS — F29 Unspecified psychosis not due to a substance or known physiological condition: Secondary | ICD-10-CM | POA: Diagnosis not present

## 2017-08-26 DIAGNOSIS — F17210 Nicotine dependence, cigarettes, uncomplicated: Secondary | ICD-10-CM | POA: Diagnosis not present

## 2017-08-26 DIAGNOSIS — F0390 Unspecified dementia without behavioral disturbance: Secondary | ICD-10-CM | POA: Diagnosis not present

## 2017-08-26 DIAGNOSIS — E1165 Type 2 diabetes mellitus with hyperglycemia: Secondary | ICD-10-CM | POA: Diagnosis not present

## 2017-08-26 DIAGNOSIS — J45909 Unspecified asthma, uncomplicated: Secondary | ICD-10-CM | POA: Diagnosis not present

## 2017-08-26 DIAGNOSIS — F250 Schizoaffective disorder, bipolar type: Secondary | ICD-10-CM

## 2017-08-26 DIAGNOSIS — G4700 Insomnia, unspecified: Secondary | ICD-10-CM | POA: Diagnosis not present

## 2017-08-26 DIAGNOSIS — G40909 Epilepsy, unspecified, not intractable, without status epilepticus: Secondary | ICD-10-CM | POA: Diagnosis not present

## 2017-08-26 DIAGNOSIS — Z0000 Encounter for general adult medical examination without abnormal findings: Secondary | ICD-10-CM | POA: Diagnosis not present

## 2017-08-26 DIAGNOSIS — E785 Hyperlipidemia, unspecified: Secondary | ICD-10-CM | POA: Diagnosis not present

## 2017-08-26 DIAGNOSIS — Z818 Family history of other mental and behavioral disorders: Secondary | ICD-10-CM | POA: Diagnosis not present

## 2017-08-26 DIAGNOSIS — E119 Type 2 diabetes mellitus without complications: Secondary | ICD-10-CM | POA: Diagnosis not present

## 2017-08-26 DIAGNOSIS — R440 Auditory hallucinations: Secondary | ICD-10-CM | POA: Diagnosis not present

## 2017-08-26 DIAGNOSIS — F39 Unspecified mood [affective] disorder: Secondary | ICD-10-CM | POA: Diagnosis not present

## 2017-08-26 DIAGNOSIS — Z79899 Other long term (current) drug therapy: Secondary | ICD-10-CM | POA: Diagnosis not present

## 2017-08-26 DIAGNOSIS — I10 Essential (primary) hypertension: Secondary | ICD-10-CM | POA: Diagnosis not present

## 2017-08-26 DIAGNOSIS — J438 Other emphysema: Secondary | ICD-10-CM | POA: Diagnosis not present

## 2017-08-26 DIAGNOSIS — K219 Gastro-esophageal reflux disease without esophagitis: Secondary | ICD-10-CM | POA: Diagnosis not present

## 2017-08-26 DIAGNOSIS — E118 Type 2 diabetes mellitus with unspecified complications: Secondary | ICD-10-CM | POA: Diagnosis not present

## 2017-08-26 DIAGNOSIS — R4587 Impulsiveness: Secondary | ICD-10-CM

## 2017-08-26 DIAGNOSIS — Z720 Tobacco use: Secondary | ICD-10-CM | POA: Diagnosis not present

## 2017-08-26 DIAGNOSIS — R443 Hallucinations, unspecified: Secondary | ICD-10-CM | POA: Diagnosis not present

## 2017-08-26 LAB — CBG MONITORING, ED
GLUCOSE-CAPILLARY: 142 mg/dL — AB (ref 65–99)
GLUCOSE-CAPILLARY: 136 mg/dL — AB (ref 65–99)

## 2017-08-26 MED ORDER — RISPERIDONE 1 MG PO TABS
1.0000 mg | ORAL_TABLET | Freq: Two times a day (BID) | ORAL | Status: DC
Start: 2017-08-26 — End: 2017-08-26

## 2017-08-26 MED ORDER — RISPERIDONE 1 MG PO TABS: 1.0000 mg | ORAL_TABLET | Freq: Two times a day (BID) | ORAL | Status: DC

## 2017-08-26 NOTE — Consult Note (Signed)
[facility] Psych ED Progress Note  08/26/2017 10:25 AM [name]  MRN:  [id number] Subjective: ''I don't know why the law brought me hear, I know I was hearing voices but I was not complaining.''  Objective: Patient was seen, interviewed, chart reviewed and treatment plan reviewed with the team. Patient with history of Paranoid Schizophrenia who was IVC'd by her daughter due to bizarre behavior/thought process, delusions, psychosis and aggressive behavior. Patient reports non-complaint with her medications and hearing voices telling her to walk the street naked which she did. Patient has been neglecting personal hygiene and grooming. Patient appears irritable, she reports poor sleep and racing thoughts.  Principal Problem: Schizoaffective disorder, bipolar type ([place]) Diagnosis:   Patient Active Problem List   Diagnosis Date Noted  . Schizoaffective disorder, bipolar type ([place]) [F25.0] 08/26/2017    Priority: High  . Schizophrenia, paranoid type ([place]) [F20.0] 08/25/2017  . Hypoglycemia [E16.2] 06/19/2017  . Diverticulosis [K57.90] 05/08/2017  . Hyperlipidemia [E78.5] 08/25/2016  . Obesity [E66.9] 06/20/2016  . Health care maintenance [Z00.00] 05/17/2016  . Skin rash [R21] 05/17/2016  . Diabetic peripheral neuropathy associated with type 2 diabetes mellitus ([place]) [E11.42] 05/17/2016  . GERD (gastroesophageal reflux disease) [K21.9] 05/17/2016  . Tobacco use [Z72.0] 05/17/2016  . Back pain of thoracolumbar region [M54.5, M54.6] 11/08/2015  . DM type 2 (diabetes mellitus, type 2) ([place]) [E11.9] 05/03/2012  . Hypertension [I10] 05/03/2012  . Anxiety [F41.9] 05/03/2012   Total Time spent with patient: 30 minutes  Past Psychiatric History: as above  Past Medical History:  Past Medical History:  Diagnosis Date  . Allergy   . Anxiety   . Arthritis   . Asthma   . Cataract   . Depression   . Diabetes mellitus    type II  . GERD (gastroesophageal reflux disease)   . Hyperlipidemia   .  Hypertension   . Neuromuscular disorder ([place])   . Seizures ([place])    last seizure Jan 2016    Past Surgical History:  Procedure Laterality Date  . APPENDECTOMY    . CYSTECTOMY    . FOOT SURGERY Bilateral 2001   x3  . SALPINGOOPHORECTOMY Left   . TUBAL LIGATION     Family History:  Family History  Problem Relation Age of Onset  . Heart disease Mother   . Diabetes Mother   . Depression Mother   . Cancer Father   . Diabetes Father   . Depression Father   . Colon cancer Father   . Diabetes Sister   . Kidney disease Brother   . Diabetes Brother   . Kidney disease Brother   . Diabetes Brother   . Diabetes Brother   . Diabetes Brother   . Colon cancer Brother   . Esophageal cancer Neg Hx   . Rectal cancer Neg Hx   . Stomach cancer Neg Hx   . Pancreatic cancer Neg Hx    Family Psychiatric  History:  Social History:  Social History   Substance and Sexual Activity  Alcohol Use Yes  . Alcohol/week: 0.0 oz   Comment: 5 shots of whisky a day.      Social History   Substance and Sexual Activity  Drug Use No    Social History   Socioeconomic History  . Marital status: Single    Spouse name: None  . Number of children: None  . Years of education: None  . Highest education level: None  Social Needs  . Financial resource strain: None  .  Food insecurity - worry: None  . Food insecurity - inability: None  . Transportation needs - medical: None  . Transportation needs - non-medical: None  Occupational History  . None  Tobacco Use  . Smoking status: Current Every Day Smoker    Packs/day: 1.00    Types: Cigarettes  . Smokeless tobacco: Never Used  . Tobacco comment: DOWN TO 3 CIGARETTES DAY  Substance and Sexual Activity  . Alcohol use: Yes    Alcohol/week: 0.0 oz    Comment: 5 shots of whisky a day.   . Drug use: No  . Sexual activity: Not Currently    Partners: Male    Birth control/protection: Post-menopausal  Other Topics Concern  . None  Social History  Narrative  . None    Sleep: Poor  Appetite:  Fair  Current Medications: Current Facility-Administered Medications  Medication Dose Route Frequency Provider Last Rate Last Dose  . 0.9 %  sodium chloride infusion  500 mL Intravenous Continuous Nelida Meuse III, MD      . acetaminophen (TYLENOL) tablet 650 mg  650 mg Oral Q4H PRN Street, [place], [place]      . alum & mag hydroxide-simeth (MAALOX/MYLANTA) 200-200-20 MG/5ML suspension 30 mL  30 mL Oral Q6H PRN Street, [place], [place]      . amLODipine (NORVASC) tablet 10 mg  10 mg Oral Daily [address] [place], [place]   10 mg at 08/26/17 9396  . divalproex (DEPAKOTE) DR tablet 250 mg  250 mg Oral Q12H Patrecia Pour, NP   250 mg at 08/26/17 0935  . famotidine (PEPCID) tablet 20 mg  20 mg Oral Daily [address] [place], [place]   20 mg at 08/26/17 8864  . gabapentin (NEURONTIN) capsule 800 mg  800 mg Oral TID Polly Cobia, RPH   800 mg at 08/26/17 0935  . metFORMIN (GLUCOPHAGE) tablet 1,000 mg  1,000 mg Oral BID WC [address], [place], [place]   1,000 mg at 08/26/17 0805  . nicotine (NICODERM CQ - dosed in mg/24 hours) patch 21 mg  21 mg Transdermal Daily [address] [place], [place]   21 mg at 08/25/17 1717  . ondansetron (ZOFRAN) tablet 4 mg  4 mg Oral Q8H PRN [address], [place], [place]      . pantoprazole (PROTONIX) EC tablet 40 mg  40 mg Oral Daily [address] [place], [place]   40 mg at 08/26/17 8472  . risperiDONE (RISPERDAL) tablet 1 mg  1 mg Oral BID Demiah Gullickson, MD       Current Outpatient Medications  Medication Sig Dispense Refill  . gabapentin (NEURONTIN) 800 MG tablet TAKE 1 TABLET   3 (THREE) TIMES DAILY. 270 tablet 1  . metFORMIN (GLUCOPHAGE) 1000 MG tablet Take 1 tablet (1,000 mg total) by mouth 2 (two) times daily with a meal. 180 tablet 3  . ACCU-CHEK FASTCLIX LANCETS MISC Use to check blood sugar 3 times a day. Dx code E11.9 102 each 12  . amLODipine (NORVASC) 10 MG tablet Take 1 tablet (10 mg total) by mouth daily. (Patient not taking: Reported  on 08/21/2017) 90 tablet 3  . atorvastatin (LIPITOR) 40 MG tablet Take 1 tablet (40 mg total) by mouth daily. (Patient not taking: Reported on 08/21/2017) 90 tablet 3  . Blood Glucose Monitoring Suppl (ACCU-CHEK AVIVA PLUS) w/Device KIT USE   TO CHECK GLUCOSE TWICE DAILY 1 kit 0  . glucose blood (ACCU-CHEK GUIDE) test strip Use to check blood sugar 3 times a day. Dx code E11.9 100 each 12  .  Insulin Detemir (LEVEMIR FLEXPEN) 100 UNIT/ML Pen Inject 25 Units into the skin at bedtime. (Patient not taking: Reported on 08/21/2017) 45 mL 3  . Insulin Pen Needle (PEN NEEDLES) 31G X 5 MM MISC 1 each by Does not apply route daily. 100 each 4  . Insulin Syringe-Needle U-100 31G X 15/64" 0.3 ML MISC Use to inject lantus insulin one time a day (Patient not taking: Reported on 07/31/2017) 100 each 11  . Lancet Devices (ACCU-CHEK SOFTCLIX) lancets Use to test blood glucose 3 times daily. Dx: E11.49, insulin dependent 300 each 3  . lisinopril (PRINIVIL,ZESTRIL) 20 MG tablet Take 1 tablet (20 mg total) by mouth daily. (Patient not taking: Reported on 08/21/2017) 30 tablet 2  . nitrofurantoin, macrocrystal-monohydrate, (MACROBID) 100 MG capsule Take 1 capsule (100 mg total) by mouth 2 (two) times daily. (Patient not taking: Reported on 08/21/2017) 20 capsule 0  . omeprazole (PRILOSEC) 20 MG capsule Take 1 capsule (20 mg total) by mouth daily. (Patient not taking: Reported on 08/21/2017) 30 capsule 0  . ondansetron (ZOFRAN ODT) 4 MG disintegrating tablet Take 1 tablet (4 mg total) by mouth every 8 (eight) hours as needed for nausea or vomiting. 20 tablet 0  . ondansetron (ZOFRAN) 4 MG tablet Take 1 tablet (4 mg total) by mouth every 8 (eight) hours as needed for nausea or vomiting. (Patient not taking: Reported on 08/21/2017) 20 tablet 0  . ranitidine (ZANTAC) 150 MG tablet Take 1 tablet (150 mg total) by mouth at bedtime. (Patient not taking: Reported on 08/21/2017) 30 tablet 0    Lab Results:  Results for orders  placed or performed during the hospital encounter of 08/25/17 (from the past 48 hour(s))  Rapid urine drug screen (hospital performed)     Status: None   Collection Time: 08/25/17  2:35 PM  Result Value Ref Range   Opiates NONE DETECTED NONE DETECTED   Cocaine NONE DETECTED NONE DETECTED   Benzodiazepines NONE DETECTED NONE DETECTED   Amphetamines NONE DETECTED NONE DETECTED   Tetrahydrocannabinol NONE DETECTED NONE DETECTED   Barbiturates NONE DETECTED NONE DETECTED    Comment:        DRUG SCREEN FOR MEDICAL PURPOSES ONLY.  IF CONFIRMATION IS NEEDED FOR ANY PURPOSE, NOTIFY LAB WITHIN 5 DAYS.        LOWEST DETECTABLE LIMITS FOR URINE DRUG SCREEN Drug Class       Cutoff (ng/mL) Amphetamine      1000 Barbiturate      200 Benzodiazepine   254 Tricyclics       270 Opiates          300 Cocaine          300 THC              50   CBC w/diff     Status: None   Collection Time: 08/25/17  2:58 PM  Result Value Ref Range   WBC 8.6 4.0 - 10.5 K/uL   RBC 4.17 3.87 - 5.11 MIL/uL   Hemoglobin 12.6 12.0 - 15.0 g/dL   HCT 37.6 36.0 - 46.0 %   MCV 90.2 78.0 - 100.0 fL   MCH 30.2 26.0 - 34.0 pg   MCHC 33.5 30.0 - 36.0 g/dL   RDW 13.3 11.5 - 15.5 %   Platelets 269 150 - 400 K/uL   Neutrophils Relative % 50 %   Neutro Abs 4.3 1.7 - 7.7 K/uL   Lymphocytes Relative 41 %   Lymphs Abs 3.5 0.7 -  4.0 K/uL   Monocytes Relative 6 %   Monocytes Absolute 0.5 0.1 - 1.0 K/uL   Eosinophils Relative 3 %   Eosinophils Absolute 0.2 0.0 - 0.7 K/uL   Basophils Relative 0 %   Basophils Absolute 0.0 0.0 - 0.1 K/uL  Comprehensive metabolic panel     Status: Abnormal   Collection Time: 08/25/17  2:58 PM  Result Value Ref Range   Sodium 138 135 - 145 mmol/L   Potassium 4.0 3.5 - 5.1 mmol/L   Chloride 102 101 - 111 mmol/L   CO2 27 22 - 32 mmol/L   Glucose, Bld 251 (H) 65 - 99 mg/dL   BUN 11 6 - 20 mg/dL   Creatinine, Ser 0.97 0.44 - 1.00 mg/dL   Calcium 9.7 8.9 - 10.3 mg/dL   Total Protein 7.7 6.5 -  8.1 g/dL   Albumin 4.8 3.5 - 5.0 g/dL   AST 28 15 - 41 U/L   ALT 29 14 - 54 U/L   Alkaline Phosphatase 85 38 - 126 U/L   Total Bilirubin 0.7 0.3 - 1.2 mg/dL   GFR calc non Af Amer 59 (L) >60 mL/min   GFR calc Af Amer >60 >60 mL/min    Comment: (NOTE) The eGFR has been calculated using the CKD EPI equation. This calculation has not been validated in all clinical situations. eGFR's persistently <60 mL/min signify possible Chronic Kidney Disease.    Anion gap 9 5 - 15  Ethanol     Status: None   Collection Time: 08/25/17  2:58 PM  Result Value Ref Range   Alcohol, Ethyl (B) <10 <10 mg/dL    Comment:        LOWEST DETECTABLE LIMIT FOR SERUM ALCOHOL IS 10 mg/dL FOR MEDICAL PURPOSES ONLY   Salicylate level     Status: None   Collection Time: 08/25/17  2:58 PM  Result Value Ref Range   Salicylate Lvl <1.1 2.8 - 30.0 mg/dL  Acetaminophen level     Status: Abnormal   Collection Time: 08/25/17  2:58 PM  Result Value Ref Range   Acetaminophen (Tylenol), Serum <10 (L) 10 - 30 ug/mL    Comment:        THERAPEUTIC CONCENTRATIONS VARY SIGNIFICANTLY. A RANGE OF 10-30 ug/mL MAY BE AN EFFECTIVE CONCENTRATION FOR MANY PATIENTS. HOWEVER, SOME ARE BEST TREATED AT CONCENTRATIONS OUTSIDE THIS RANGE. ACETAMINOPHEN CONCENTRATIONS >150 ug/mL AT 4 HOURS AFTER INGESTION AND >50 ug/mL AT 12 HOURS AFTER INGESTION ARE OFTEN ASSOCIATED WITH TOXIC REACTIONS.   CBG monitoring, ED     Status: Abnormal   Collection Time: 08/25/17  3:15 PM  Result Value Ref Range   Glucose-Capillary 236 (H) 65 - 99 mg/dL  CBG monitoring, ED     Status: Abnormal   Collection Time: 08/25/17 10:22 PM  Result Value Ref Range   Glucose-Capillary 170 (H) 65 - 99 mg/dL   Comment 1 Notify RN    Comment 2 Document in Chart   CBG monitoring, ED     Status: Abnormal   Collection Time: 08/26/17  7:55 AM  Result Value Ref Range   Glucose-Capillary 142 (H) 65 - 99 mg/dL   Comment 1 Notify RN    Comment 2 Document in Chart      Blood Alcohol level:  Lab Results  Component Value Date   ETH <10 08/25/2017   ETH <5 01/12/2017    Physical Findings: AIMS:  , ,  ,  ,    CIWA:  COWS:     Musculoskeletal: Strength & Muscle Tone: within normal limits Gait & Station: normal Patient leans: N/A  Psychiatric Specialty Exam: Physical Exam  Psychiatric: Her affect is labile. Her speech is rapid and/or pressured. She is aggressive, actively hallucinating and combative. Thought content is paranoid and delusional. Cognition and memory are normal. She expresses impulsivity.    Review of Systems  Constitutional: Negative.   HENT: Negative.   Eyes: Negative.   Respiratory: Negative.   Cardiovascular: Negative.   Gastrointestinal: Negative.   Genitourinary: Negative.   Musculoskeletal: Negative.   Skin: Positive for rash.  Neurological: Negative.   Endo/Heme/Allergies: Negative.   Psychiatric/Behavioral: Positive for hallucinations. The patient has insomnia.     Blood pressure 127/67, pulse 89, temperature 98.4 F (36.9 C), temperature source Oral, resp. rate 16, SpO2 93 %.There is no height or weight on file to calculate BMI.  General Appearance: Casual  Eye Contact:  Good  Speech:  Pressured  Volume:  Increased  Mood:  Irritable  Affect:  Labile  Thought Process:  Disorganized  Orientation:  Full (Time, Place, and Person)  Thought Content:  Illogical, Delusions and Hallucinations: Auditory  Suicidal Thoughts:  No  Homicidal Thoughts:  No  Memory:  Immediate;   Fair Recent;   Fair Remote;   Fair  Judgement:  Impaired  Insight:  Lacking  Psychomotor Activity:  Increased  Concentration:  Concentration: Fair and Attention Span: Fair  Recall:  [employer] of Knowledge:  Fair  Language:  Fair  Akathisia:  No  Handed:  Right  AIMS (if indicated):     Assets:  Housing Social Support  ADL's:  Intact  Cognition:  WNL  Sleep:   poor      Treatment Plan Summary: Daily contact with patient to  assess and evaluate symptoms and progress in treatment. -Crisis stabilization -Medication management:   -Increase Risperdal to 1 mg BID for psychosis. -Continue  Depakote 250 mg BID for mood stabilization -Continue Gabapentin 800 mg TID for mood -Individual counseling  Disposition: Recommend psychiatric Inpatient admission when medically cleared.                      Looking for [facility] inpatient placement.    Corena Pilgrim, MD 08/26/2017, 10:25 AM

## 2017-08-26 NOTE — ED Notes (Signed)
Report called to RN Zella Richer, [place] at 6pm.

## 2017-08-26 NOTE — ED Notes (Signed)
Pt awake, alert & responsive, no distress noted, calm & cooperative at present.  Complaint of being off meds x 1 yr,, walking up & down street naked from the waist down.  Denies SI, HI or AVH, but rambles during conversation.  Pt is IVC. With Hx of Bipolar DO and Schizophrenia.  Monitoring for safety, Q 15 min checks in effect.

## 2017-08-26 NOTE — ED Notes (Signed)
Pt to BR without assistance.  Pt requesting diaper stating "So I don't have to get up."

## 2017-08-26 NOTE — BHH Counselor (Addendum)
[id number] Per Shirlee Limerick, pt has been accepted by Dr Pablo Ledger and can arrive at 1830 or later. # for report is [phone]. Writer left voicemail for [employer] for transportation [phone]. Writer updated pt's Therapist, sports. Latricia RN reports [facility] called back to say he'll pick up pt at 1800.  Arnold Long, [place] [facility]ist    [phone] Per Shirlee Limerick at [place], they have beds. Writer faxed pt referral to [place].  Writer also faxed referrals to: [facility] - (per [place]) [place], [place] [facility]

## 2017-08-26 NOTE — B[facility] Counselor (Signed)
Dr Darleene Cleaver completed [facility] and writer placed it in pt's chart.  Arnold Long, [place] [facility]ist

## 2017-08-31 ENCOUNTER — Ambulatory Visit: Payer: Medicare HMO | Admitting: Nurse Practitioner

## 2017-09-07 ENCOUNTER — Ambulatory Visit: Payer: Medicare HMO

## 2017-09-12 ENCOUNTER — Inpatient Hospital Stay: Payer: Self-pay | Admitting: Nurse Practitioner

## 2017-09-14 ENCOUNTER — Ambulatory Visit: Payer: Medicare HMO | Admitting: Nurse Practitioner

## 2017-09-17 ENCOUNTER — Ambulatory Visit: Payer: Medicare HMO | Admitting: Nurse Practitioner

## 2017-09-17 ENCOUNTER — Inpatient Hospital Stay: Payer: Self-pay | Admitting: Nurse Practitioner

## 2017-09-19 ENCOUNTER — Encounter (HOSPITAL_COMMUNITY): Payer: Self-pay | Admitting: *Deleted

## 2017-09-19 ENCOUNTER — Emergency Department (HOSPITAL_COMMUNITY)
Admission: EM | Admit: 2017-09-19 | Discharge: 2017-09-19 | Disposition: A | Discharge status: Home / Self Care | Payer: Medicare Other | Attending: Emergency Medicine | Admitting: Emergency Medicine

## 2017-09-19 DIAGNOSIS — N898 Other specified noninflammatory disorders of vagina: Secondary | ICD-10-CM | POA: Insufficient documentation

## 2017-09-19 DIAGNOSIS — Z5321 Procedure and treatment not carried out due to patient leaving prior to being seen by health care provider: Secondary | ICD-10-CM | POA: Diagnosis not present

## 2017-09-19 NOTE — ED Notes (Signed)
Patient left AMA per registration

## 2017-09-19 NOTE — ED Triage Notes (Signed)
Pt complains of vaginal bleeding for the past couple of days. Pt also states she has not had appetite.

## 2017-09-21 ENCOUNTER — Encounter: Payer: Self-pay | Admitting: Nurse Practitioner

## 2017-09-21 ENCOUNTER — Ambulatory Visit (INDEPENDENT_AMBULATORY_CARE_PROVIDER_SITE_OTHER): Payer: Medicare Other | Admitting: Nurse Practitioner

## 2017-09-21 VITALS — BP 130/84 | HR 79 | Temp 97.9°F | Ht 66.0 in | Wt 163.0 lb

## 2017-09-21 DIAGNOSIS — I10 Essential (primary) hypertension: Secondary | ICD-10-CM

## 2017-09-21 DIAGNOSIS — E11649 Type 2 diabetes mellitus with hypoglycemia without coma: Secondary | ICD-10-CM | POA: Diagnosis not present

## 2017-09-21 DIAGNOSIS — M6281 Muscle weakness (generalized): Secondary | ICD-10-CM | POA: Diagnosis not present

## 2017-09-21 DIAGNOSIS — K5900 Constipation, unspecified: Secondary | ICD-10-CM

## 2017-09-21 DIAGNOSIS — R110 Nausea: Secondary | ICD-10-CM

## 2017-09-21 DIAGNOSIS — Z794 Long term (current) use of insulin: Secondary | ICD-10-CM

## 2017-09-21 DIAGNOSIS — K219 Gastro-esophageal reflux disease without esophagitis: Secondary | ICD-10-CM

## 2017-09-21 MED ORDER — POLYETHYLENE GLYCOL 3350 17 GM/SCOOP PO POWD: 17.0000 g | Freq: Every day | ORAL | 1 refills | Status: DC

## 2017-09-21 MED ORDER — LISINOPRIL 20 MG PO TABS: 20.0000 mg | ORAL_TABLET | Freq: Every day | ORAL | 1 refills | Status: DC

## 2017-09-21 MED ORDER — METFORMIN HCL 500 MG PO TABS: 500.0000 mg | ORAL_TABLET | Freq: Two times a day (BID) | ORAL | 1 refills | Status: DC

## 2017-09-21 MED ORDER — OMEPRAZOLE 20 MG PO CPDR: 20.0000 mg | DELAYED_RELEASE_CAPSULE | Freq: Every day | ORAL | 0 refills | Status: DC

## 2017-09-21 MED ORDER — ONDANSETRON HCL 4 MG PO TABS: 4.0000 mg | ORAL_TABLET | Freq: Three times a day (TID) | ORAL | 0 refills | Status: DC | PRN

## 2017-09-21 NOTE — Patient Instructions (Signed)
Go to lab for urine collection  Return to office 10/01/2016 for antipsychotic injection.
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
## 2017-09-22 LAB — URINALYSIS W MICROSCOPIC + REFLEX CULTURE
Bilirubin Urine: NEGATIVE
Glucose, UA: NEGATIVE
HYALINE CAST: NONE SEEN /LPF
Hgb urine dipstick: NEGATIVE
Ketones, ur: NEGATIVE
Leukocyte Esterase: NEGATIVE
Nitrites, Initial: NEGATIVE
PH: 6 (ref 5.0–8.0)
RBC / HPF: NONE SEEN /HPF (ref 0–2)
Specific Gravity, Urine: 1.015 (ref 1.001–1.03)

## 2017-09-22 LAB — NO CULTURE INDICATED

## 2017-09-28 ENCOUNTER — Telehealth: Payer: Self-pay | Admitting: Nurse Practitioner

## 2017-09-28 NOTE — Telephone Encounter (Signed)
Attempted to call patient to schedule AWV. Patient's vm not set up to leave a message. Will try to call patient again at a later time. SF

## 2017-10-01 ENCOUNTER — Encounter: Payer: Self-pay | Admitting: Nurse Practitioner

## 2017-10-01 ENCOUNTER — Ambulatory Visit (INDEPENDENT_AMBULATORY_CARE_PROVIDER_SITE_OTHER): Payer: Medicare Other | Admitting: Nurse Practitioner

## 2017-10-01 VITALS — BP 140/82 | HR 89 | Temp 97.7°F | Ht 66.0 in | Wt 157.0 lb

## 2017-10-01 DIAGNOSIS — R110 Nausea: Secondary | ICD-10-CM

## 2017-10-01 DIAGNOSIS — E11649 Type 2 diabetes mellitus with hypoglycemia without coma: Secondary | ICD-10-CM | POA: Diagnosis not present

## 2017-10-01 DIAGNOSIS — I10 Essential (primary) hypertension: Secondary | ICD-10-CM | POA: Diagnosis not present

## 2017-10-01 DIAGNOSIS — E1142 Type 2 diabetes mellitus with diabetic polyneuropathy: Secondary | ICD-10-CM

## 2017-10-01 DIAGNOSIS — Z794 Long term (current) use of insulin: Secondary | ICD-10-CM

## 2017-10-01 DIAGNOSIS — K219 Gastro-esophageal reflux disease without esophagitis: Secondary | ICD-10-CM | POA: Diagnosis not present

## 2017-10-01 DIAGNOSIS — F250 Schizoaffective disorder, bipolar type: Secondary | ICD-10-CM | POA: Diagnosis not present

## 2017-10-01 MED ORDER — OMEPRAZOLE 20 MG PO CPDR: 20.0000 mg | DELAYED_RELEASE_CAPSULE | Freq: Every day | ORAL | 1 refills | Status: DC

## 2017-10-01 MED ORDER — METOPROLOL SUCCINATE ER 25 MG PO TB24: 25.0000 mg | ORAL_TABLET | Freq: Every day | ORAL | 3 refills | Status: DC

## 2017-10-01 MED ORDER — METFORMIN HCL 500 MG PO TABS: 500.0000 mg | ORAL_TABLET | Freq: Every day | ORAL | 1 refills | Status: AC

## 2017-10-01 MED ORDER — FLUPHENAZINE DECANOATE 25 MG/ML IJ SOLN
50.0000 mg | INTRAMUSCULAR | Status: AC
  Administered 2017-10-01: 50 mg via INTRAMUSCULAR

## 2017-10-01 MED ORDER — ONDANSETRON HCL 4 MG PO TABS: 4.0000 mg | ORAL_TABLET | Freq: Two times a day (BID) | ORAL | 0 refills | Status: DC

## 2017-10-01 NOTE — Assessment & Plan Note (Signed)
Last hgbA1c of 6.6 (07/31/2017). Metformin dose decreased to 500mg  once a day due to nausea and vomiting. Entered referral to podiatry and ophthalmology.

## 2017-10-01 NOTE — Assessment & Plan Note (Signed)
Advised patient about need for psychiatry evaluation and management of psychiatry medications. She declined stating she will not pay for those visits.

## 2017-10-01 NOTE — Patient Instructions (Addendum)
You need to follow up with Psychiatry as soon as possible for medication management. Referral entered.  You will be contacted to schedule appt with ophthalmology and podiatry.  Decrease metformin dose to 500mg  once a day with food.

## 2017-10-01 NOTE — Assessment & Plan Note (Signed)
Improved with lisinopril. Added metoprolol 25mg  BP Readings from Last 3 Encounters:  10/01/17 140/82  09/21/17 130/84  09/19/17 (!) 169/90

## 2017-10-01 NOTE — Progress Notes (Signed)
Subjective:  Patient ID: [name], female    DOB: Jul 17, 1951  Age: 67 y.o. MRN: [id number]  CC: Follow-up (2 wk follow up BP and DM/ fluphenazine injection?/blur vison and anxious?)   HPI  Schizophrenia: Current use of prolixin IM only She does not want to see psychiatry nor psychology. Reports increased anxiety. Denies any hallucination. Last behavioral health admission: 08/26/2017.  Nausea and ABD pain: Improved with omeprazole and zofran Still needs zofran in order to tolerate food. Reports normal BM and urination.  DM: Does not check glucose at home. Reports she is unable to find glucometer. Current use of metformin BID. Last HgbA1c of 6.6 No hypoglycemic episodes.  HTN: improved with lisinopril. BP Readings from Last 3 Encounters:  10/01/17 140/82  09/21/17 130/84  09/19/17 (!) 169/90   Outpatient Medications Prior to Visit  Medication Sig Dispense Refill  . ACCU-CHEK FASTCLIX LANCETS MISC Use to check blood sugar 3 times a day. Dx code E11.9 102 each 12  . benztropine (COGENTIN) 1 MG tablet Take by mouth.    . Blood Glucose Monitoring Suppl (ACCU-CHEK AVIVA PLUS) w/Device KIT USE   TO CHECK GLUCOSE TWICE DAILY 1 kit 0  . fluPHENAZine (PROLIXIN) 10 MG tablet Take 10 mg by mouth 2 (two) times daily.  0  . fluPHENAZine decanoate (PROLIXIN) 25 MG/ML injection INJECT 2 ML INTO MUSCLE EVERY 30 DAYS.NEXT DOSE DUE 10/01/17  2  . glucose blood (ACCU-CHEK GUIDE) test strip Use to check blood sugar 3 times a day. Dx code E11.9 100 each 12  . hydrOXYzine (ATARAX/VISTARIL) 25 MG tablet TAKE 1 TABLET BY MOUTH AS NEEDED FOR ITCHING  0  . Lancet Devices (ACCU-CHEK SOFTCLIX) lancets Use to test blood glucose 3 times daily. Dx: E11.49, insulin dependent 300 each 3  . lisinopril (PRINIVIL,ZESTRIL) 20 MG tablet Take 1 tablet (20 mg total) by mouth daily. 90 tablet 1  . NON FORMULARY Urinary health cranberry    . NON FORMULARY Advance Formula Eyedrops.    . polyethylene glycol  powder (GLYCOLAX/MIRALAX) powder Take 17 g by mouth daily. 850 g 1  . metFORMIN (GLUCOPHAGE) 500 MG tablet Take 1 tablet (500 mg total) by mouth 2 (two) times daily with a meal. 180 tablet 1  . omeprazole (PRILOSEC) 20 MG capsule Take 1 capsule (20 mg total) by mouth daily. 30 capsule 0  . ondansetron (ZOFRAN) 4 MG tablet Take 1 tablet (4 mg total) by mouth every 8 (eight) hours as needed for nausea or vomiting. 20 tablet 0  . atorvastatin (LIPITOR) 40 MG tablet Take 1 tablet (40 mg total) by mouth daily. (Patient not taking: Reported on 10/01/2017) 90 tablet 3  . gabapentin (NEURONTIN) 800 MG tablet TAKE 1 TABLET   3 (THREE) TIMES DAILY. (Patient not taking: Reported on 10/01/2017) 270 tablet 1  . FLUoxetine (PROZAC) 20 MG capsule Take 20 mg by mouth daily.  0   Facility-Administered Medications Prior to Visit  Medication Dose Route Frequency Provider Last Rate Last Dose  . 0.9 %  sodium chloride infusion  500 mL Intravenous Continuous Nelida Meuse III, MD        ROS See HPI  Objective:  BP 140/82   Pulse 89   Temp 97.7 F (36.5 C)   Ht _0  (1.676 m)   Wt 157 lb (71.2 kg)   SpO2 97%   BMI 25.34 kg/m   BP Readings from Last 3 Encounters:  10/01/17 140/82  09/21/17 130/84  09/19/17 (!) 169/90  Wt Readings from Last 3 Encounters:  10/01/17 157 lb (71.2 kg)  09/21/17 163 lb (73.9 kg)  07/31/17 163 lb (73.9 kg)    Physical Exam  Constitutional: She is oriented to person, place, and time. No distress.  Cardiovascular: Normal rate.  Pulmonary/Chest: Effort normal.  Musculoskeletal: Normal range of motion.  Neurological: She is alert and oriented to person, place, and time.  Psychiatric: Cognition and memory are normal.  Thoughts seem disorganized. changing topics when asked a question.  Vitals reviewed.   Lab Results  Component Value Date   WBC 8.6 08/25/2017   HGB 12.6 08/25/2017   HCT 37.6 08/25/2017   PLT 269 08/25/2017   GLUCOSE 251 (H) 08/25/2017   CHOL  150 08/22/2016   TRIG 155 (H) 08/22/2016   HDL 38 (L) 08/22/2016   LDLCALC 81 08/22/2016   ALT 29 08/25/2017   AST 28 08/25/2017   NA 138 08/25/2017   K 4.0 08/25/2017   CL 102 08/25/2017   CREATININE 0.97 08/25/2017   BUN 11 08/25/2017   CO2 27 08/25/2017   TSH 2.283 01/12/2017   HGBA1C 6.6 (H) 07/31/2017   MICROALBUR 29.9 (H) 07/31/2017    No results found.  Assessment & Plan:   [name] was seen today for follow-up.  Diagnoses and all orders for this visit:  Schizoaffective disorder, bipolar type ([place]) -     Ambulatory referral to Psychiatry -     fluPHENAZine decanoate (PROLIXIN) injection 50 mg  Diabetic peripheral neuropathy associated with type 2 diabetes mellitus ([place]) -     Ambulatory referral to Ophthalmology -     Ambulatory referral to Podiatry  Type 2 diabetes mellitus with hypoglycemia without coma, with long-term current use of insulin ([place]) -     Ambulatory referral to Ophthalmology -     Ambulatory referral to Podiatry -     metFORMIN (GLUCOPHAGE) 500 MG tablet; Take 1 tablet (500 mg total) by mouth daily with breakfast.  Gastroesophageal reflux disease, esophagitis presence not specified -     omeprazole (PRILOSEC) 20 MG capsule; Take 1 capsule (20 mg total) by mouth daily.  Nausea -     ondansetron (ZOFRAN) 4 MG tablet; Take 1 tablet (4 mg total) by mouth 2 (two) times daily.  Essential hypertension -     metoprolol succinate (TOPROL-XL) 25 MG 24 hr tablet; Take 1 tablet (25 mg total) by mouth daily.   I have discontinued [name]'s FLUoxetine. I have also changed her metFORMIN and ondansetron. Additionally, I am having her start on metoprolol succinate. Lastly, I am having her maintain her ACCU-CHEK AVIVA PLUS, atorvastatin, ACCU-CHEK FASTCLIX LANCETS, accu-chek softclix, glucose blood, gabapentin, benztropine, fluPHENAZine, hydrOXYzine, fluPHENAZine decanoate, NON FORMULARY, NON FORMULARY, polyethylene glycol powder, lisinopril, and omeprazole.  We administered fluPHENAZine decanoate. We will continue to administer sodium chloride and fluPHENAZine decanoate.  Meds ordered this encounter  Medications  . metFORMIN (GLUCOPHAGE) 500 MG tablet    Sig: Take 1 tablet (500 mg total) by mouth daily with breakfast.    Dispense:  90 tablet    Refill:  1    Order Specific Question:   Supervising Provider    Answer:   Lucille Passy [[id number]]  . omeprazole (PRILOSEC) 20 MG capsule    Sig: Take 1 capsule (20 mg total) by mouth daily.    Dispense:  90 capsule    Refill:  1    Order Specific Question:   Supervising Provider    Answer:   Arnette Norris  M [[id number]]  . ondansetron (ZOFRAN) 4 MG tablet    Sig: Take 1 tablet (4 mg total) by mouth 2 (two) times daily.    Dispense:  30 tablet    Refill:  0    Order Specific Question:   Supervising Provider    Answer:   Lucille Passy [[id number]]  . fluPHENAZine decanoate (PROLIXIN) injection 50 mg  . metoprolol succinate (TOPROL-XL) 25 MG 24 hr tablet    Sig: Take 1 tablet (25 mg total) by mouth daily.    Dispense:  90 tablet    Refill:  3    Order Specific Question:   Supervising Provider    Answer:   Lucille Passy [[id number]]    Follow-up: Return in about 3 months (around 12/30/2017) for DM and HTN, hyperlipidemia (fasting), will need EKG.  Wilfred Lacy, NP

## 2017-10-08 ENCOUNTER — Encounter (INDEPENDENT_AMBULATORY_CARE_PROVIDER_SITE_OTHER): Payer: Self-pay | Admitting: Ophthalmology

## 2017-10-17 ENCOUNTER — Encounter (INDEPENDENT_AMBULATORY_CARE_PROVIDER_SITE_OTHER): Payer: Medicare Other | Admitting: Ophthalmology

## 2017-10-19 ENCOUNTER — Encounter: Payer: Self-pay | Admitting: Podiatry

## 2017-10-19 ENCOUNTER — Ambulatory Visit: Payer: Medicare HMO | Admitting: Podiatry

## 2017-10-19 ENCOUNTER — Ambulatory Visit: Payer: Medicare Other | Admitting: Podiatry

## 2017-10-19 DIAGNOSIS — M79674 Pain in right toe(s): Secondary | ICD-10-CM

## 2017-10-19 DIAGNOSIS — E1140 Type 2 diabetes mellitus with diabetic neuropathy, unspecified: Secondary | ICD-10-CM

## 2017-10-19 DIAGNOSIS — M79675 Pain in left toe(s): Secondary | ICD-10-CM | POA: Diagnosis not present

## 2017-10-19 DIAGNOSIS — Q828 Other specified congenital malformations of skin: Secondary | ICD-10-CM

## 2017-10-19 DIAGNOSIS — B351 Tinea unguium: Secondary | ICD-10-CM

## 2017-10-23 NOTE — Progress Notes (Signed)
Subjective: 67 y.o. returns the office today for painful, elongated, thickened toenails which she cannot trim herself. Denies any redness or drainage around the nails. She also has calluses to both of her feet but denies any redness, drainage from the callus sites. Denies any acute changes since last appointment and no new complaints today. Denies any systemic complaints such as fevers, chills, nausea, vomiting.   Objective: AAO 3, NAD DP/PT pulses palpable, CRT less than 3 seconds Protective sensation decreased with Simms Weinstein monofilament, Achilles tendon reflex intact.  Nails hypertrophic, dystrophic, elongated, brittle, discolored 10. There is tenderness overlying the nails 1-5 bilaterally. There is no surrounding erythema or drainage along the nail sites. Hyperkeratotic lesions to bilateral plantar/posterior heel and right submetatarsal 3. No underlying ulceration, drainage, or signs of infection No open lesions or pre-ulcerative lesions are identified. No other areas of tenderness bilateral lower extremities. No overlying edema, erythema, increased warmth. No pain with calf compression, swelling, warmth, erythema. Overall, no changes.   Assessment: Patient presents with symptomatic onychomycosis, hyperkeratotic lesions  Plan: -Treatment options including alternatives, risks, complications were discussed -Nails sharply debrided 10 without complication/bleeding. -Hyperkeratotic lesion sharply debrided 5 without complications or bleeding. -Discussed daily foot inspection. If there are any changes, to call the office immediately.  -Follow-up in 3 months or sooner if any problems are to arise. In the meantime, encouraged to call the office with any questions, concerns, changes symptoms.  Celesta Gentile, DPM

## 2017-10-25 ENCOUNTER — Ambulatory Visit: Payer: Self-pay

## 2017-11-05 ENCOUNTER — Encounter (INDEPENDENT_AMBULATORY_CARE_PROVIDER_SITE_OTHER): Payer: Medicare Other | Admitting: Ophthalmology

## 2017-11-14 ENCOUNTER — Telehealth: Payer: Self-pay | Admitting: *Deleted

## 2017-11-14 NOTE — Telephone Encounter (Signed)
I reviewed Meds & Orders and Gabapentin was prescribed by Dr. Marlowe Sax 07/24/2017. I informed pt she would need to contact Dr. Marlowe Sax for refills, pt states understanding.

## 2017-11-14 NOTE — Telephone Encounter (Signed)
Pt request gabapentin for pain.

## 2017-12-19 ENCOUNTER — Ambulatory Visit (HOSPITAL_COMMUNITY): Payer: Self-pay | Admitting: Psychiatry

## 2018-03-29 IMAGING — CR DG CHEST 2V
2 series · 2 of 2 positions shown · non-contrast
Comparison: None.

CLINICAL DATA: Cough, rash, feet swelling. History of hypertension,
diabetes, asthma and seizures.

EXAM:
CHEST  2 VIEW

[w chest lat]
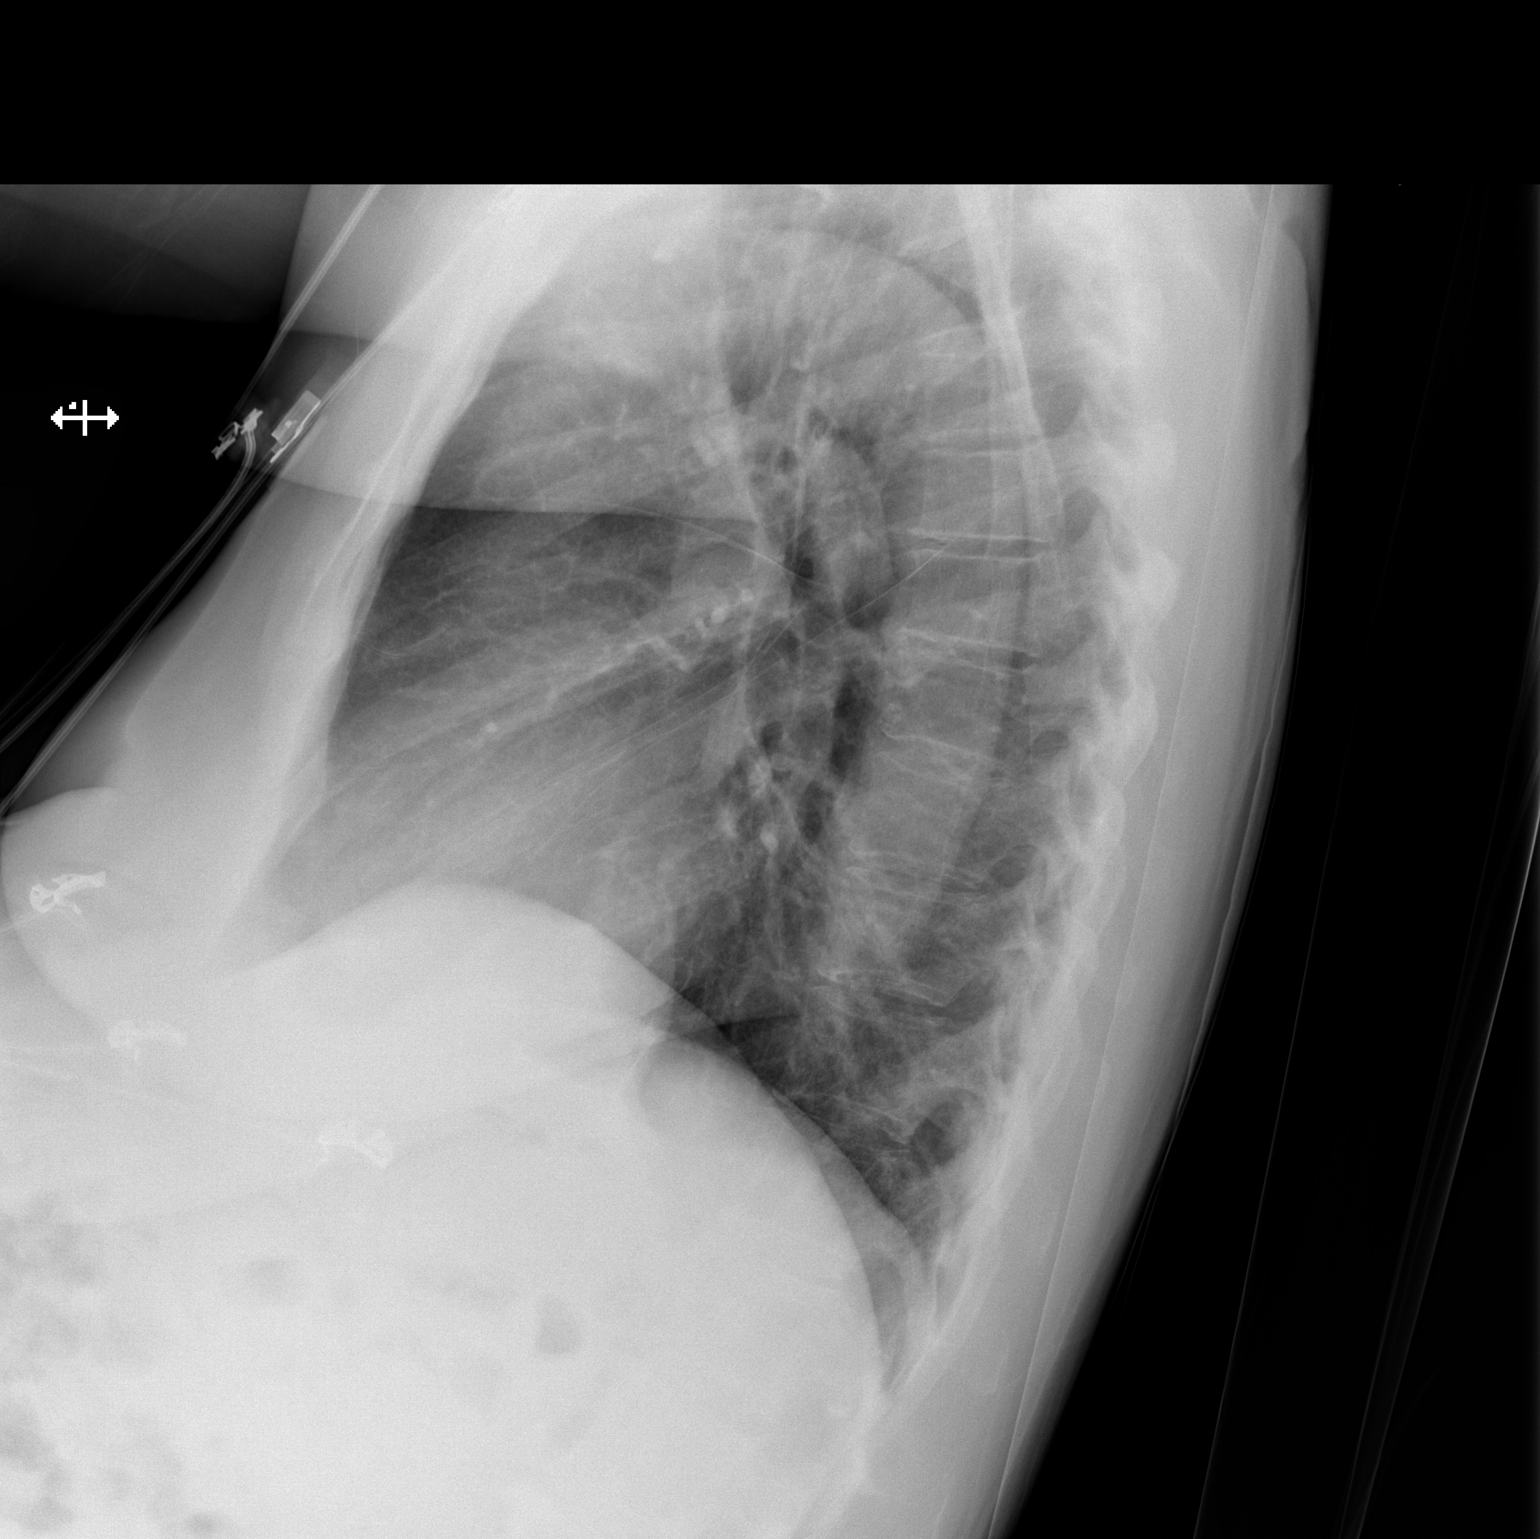

[x chest ap]
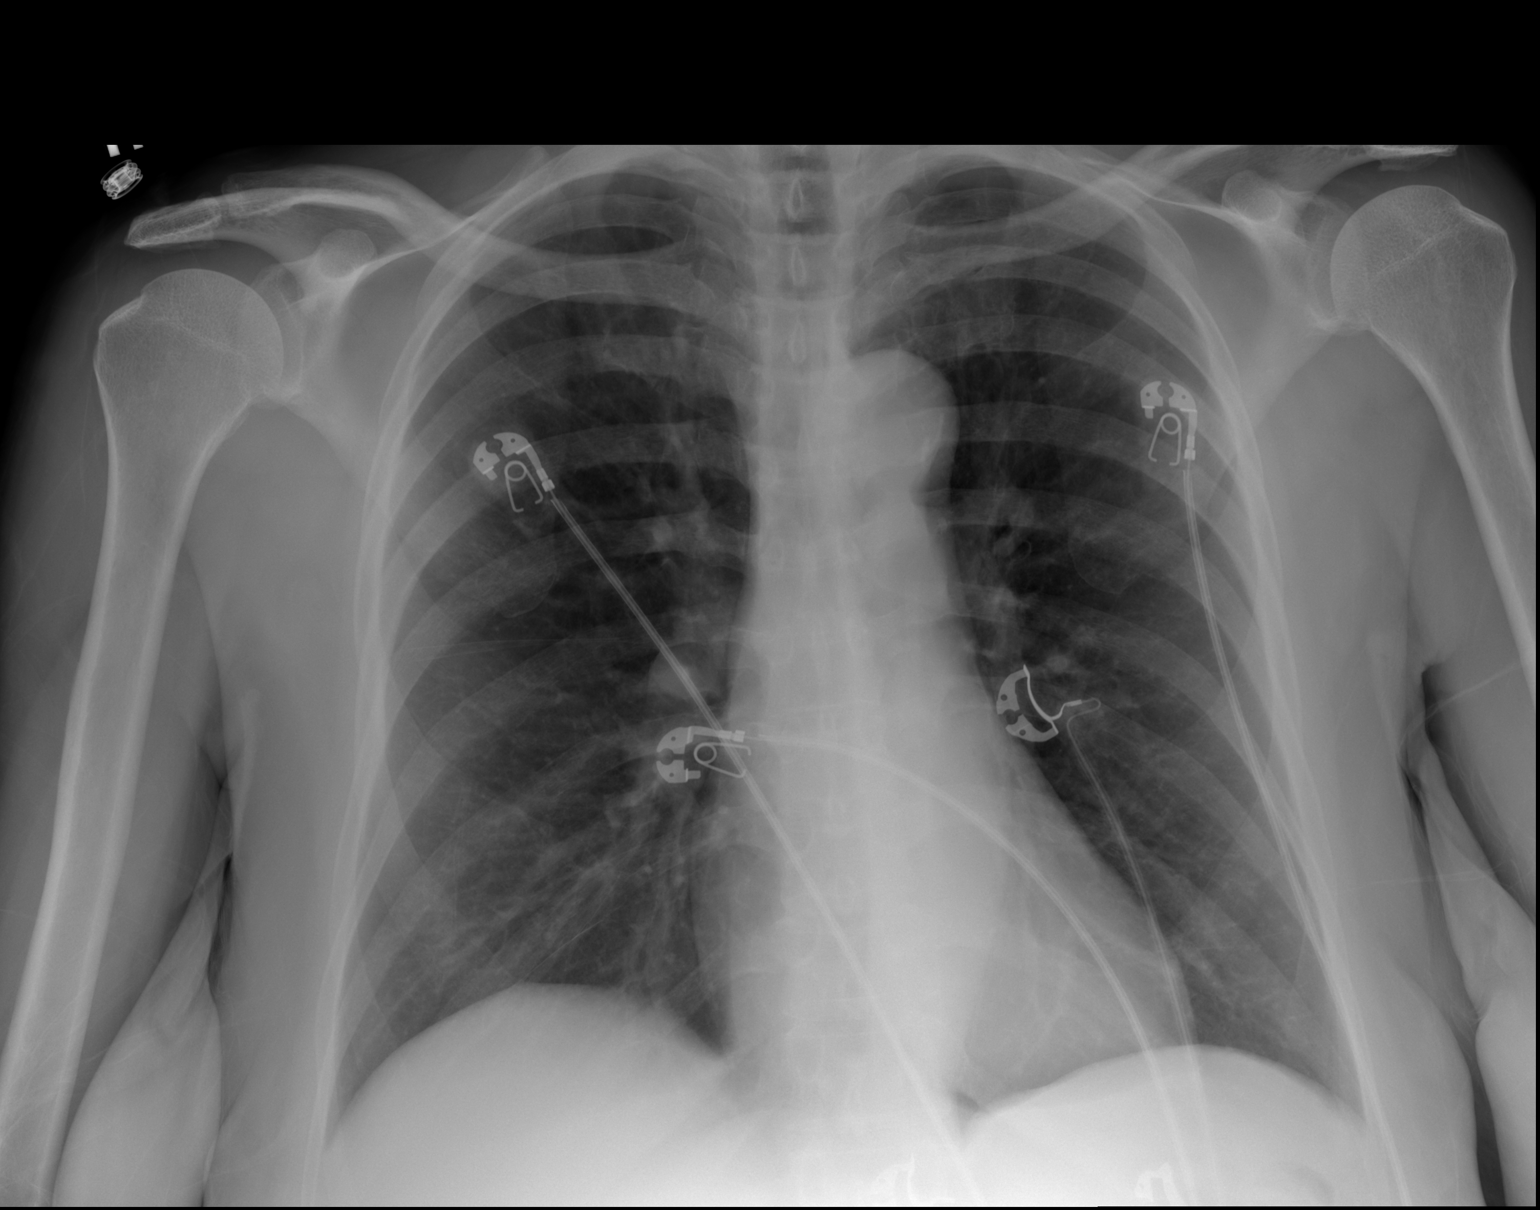

[2 of 2 positions shown; findings below may reference images not displayed]

FINDINGS: Normal cardiac silhouette and mediastinal contours. Atherosclerotic
plaque within the thoracic aorta. No focal airspace opacities. No
pleural effusion or pneumothorax. There is a minimal amount of
pleuroparenchymal thickening about the right minor and bilateral
major fissures. No evidence of edema. No acute osseus abnormalities.
IMPRESSION: 1.  No acute cardiopulmonary disease.
2.  Aortic Atherosclerosis ([id number]).

## 2018-09-20 DIAGNOSIS — K219 Gastro-esophageal reflux disease without esophagitis: Secondary | ICD-10-CM | POA: Diagnosis not present

## 2018-09-20 DIAGNOSIS — R05 Cough: Secondary | ICD-10-CM | POA: Diagnosis not present

## 2018-09-20 DIAGNOSIS — Z86010 Personal history of colonic polyps: Secondary | ICD-10-CM | POA: Diagnosis not present

## 2018-09-20 DIAGNOSIS — F203 Undifferentiated schizophrenia: Secondary | ICD-10-CM | POA: Diagnosis not present

## 2018-10-01 DIAGNOSIS — Z79899 Other long term (current) drug therapy: Secondary | ICD-10-CM | POA: Diagnosis not present

## 2018-10-01 DIAGNOSIS — F424 Excoriation (skin-picking) disorder: Secondary | ICD-10-CM | POA: Diagnosis not present

## 2018-10-01 DIAGNOSIS — L989 Disorder of the skin and subcutaneous tissue, unspecified: Secondary | ICD-10-CM | POA: Diagnosis not present

## 2018-10-03 DIAGNOSIS — E119 Type 2 diabetes mellitus without complications: Secondary | ICD-10-CM | POA: Diagnosis not present

## 2018-10-03 DIAGNOSIS — G8311 Monoplegia of lower limb affecting right dominant side: Secondary | ICD-10-CM | POA: Diagnosis not present

## 2018-10-03 DIAGNOSIS — Z79899 Other long term (current) drug therapy: Secondary | ICD-10-CM | POA: Diagnosis not present

## 2018-10-03 DIAGNOSIS — F200 Paranoid schizophrenia: Secondary | ICD-10-CM | POA: Diagnosis not present

## 2018-10-03 DIAGNOSIS — R4182 Altered mental status, unspecified: Secondary | ICD-10-CM | POA: Diagnosis not present

## 2018-10-03 DIAGNOSIS — I6381 Other cerebral infarction due to occlusion or stenosis of small artery: Secondary | ICD-10-CM | POA: Diagnosis not present

## 2018-10-03 DIAGNOSIS — G629 Polyneuropathy, unspecified: Secondary | ICD-10-CM | POA: Diagnosis not present

## 2018-10-03 DIAGNOSIS — Z7289 Other problems related to lifestyle: Secondary | ICD-10-CM | POA: Diagnosis not present

## 2018-10-03 DIAGNOSIS — E785 Hyperlipidemia, unspecified: Secondary | ICD-10-CM | POA: Diagnosis not present

## 2018-10-03 DIAGNOSIS — F17210 Nicotine dependence, cigarettes, uncomplicated: Secondary | ICD-10-CM | POA: Diagnosis not present

## 2018-10-03 DIAGNOSIS — Z7984 Long term (current) use of oral hypoglycemic drugs: Secondary | ICD-10-CM | POA: Diagnosis not present

## 2018-10-03 DIAGNOSIS — I10 Essential (primary) hypertension: Secondary | ICD-10-CM | POA: Diagnosis not present

## 2018-10-14 DIAGNOSIS — H903 Sensorineural hearing loss, bilateral: Secondary | ICD-10-CM | POA: Diagnosis not present

## 2018-11-18 DIAGNOSIS — F209 Schizophrenia, unspecified: Secondary | ICD-10-CM | POA: Diagnosis not present

## 2018-11-18 DIAGNOSIS — F900 Attention-deficit hyperactivity disorder, predominantly inattentive type: Secondary | ICD-10-CM | POA: Diagnosis not present

## 2018-11-18 DIAGNOSIS — E1165 Type 2 diabetes mellitus with hyperglycemia: Secondary | ICD-10-CM | POA: Diagnosis not present

## 2018-11-18 DIAGNOSIS — J439 Emphysema, unspecified: Secondary | ICD-10-CM | POA: Diagnosis not present

## 2018-11-18 DIAGNOSIS — I361 Nonrheumatic tricuspid (valve) insufficiency: Secondary | ICD-10-CM | POA: Diagnosis not present

## 2018-11-18 DIAGNOSIS — F17200 Nicotine dependence, unspecified, uncomplicated: Secondary | ICD-10-CM | POA: Diagnosis not present

## 2018-11-18 DIAGNOSIS — R21 Rash and other nonspecific skin eruption: Secondary | ICD-10-CM | POA: Diagnosis not present

## 2018-11-18 DIAGNOSIS — I10 Essential (primary) hypertension: Secondary | ICD-10-CM | POA: Diagnosis not present

## 2018-11-18 DIAGNOSIS — D72829 Elevated white blood cell count, unspecified: Secondary | ICD-10-CM | POA: Diagnosis not present

## 2018-11-18 DIAGNOSIS — Z9114 Patient's other noncompliance with medication regimen: Secondary | ICD-10-CM | POA: Diagnosis not present

## 2018-11-18 DIAGNOSIS — E860 Dehydration: Secondary | ICD-10-CM | POA: Diagnosis not present

## 2018-11-18 DIAGNOSIS — Z723 Lack of physical exercise: Secondary | ICD-10-CM | POA: Diagnosis not present

## 2018-11-18 DIAGNOSIS — I252 Old myocardial infarction: Secondary | ICD-10-CM | POA: Diagnosis not present

## 2018-11-18 DIAGNOSIS — F410 Panic disorder [episodic paroxysmal anxiety] without agoraphobia: Secondary | ICD-10-CM | POA: Diagnosis not present

## 2018-11-18 DIAGNOSIS — E872 Acidosis: Secondary | ICD-10-CM | POA: Diagnosis not present

## 2018-11-18 DIAGNOSIS — F424 Excoriation (skin-picking) disorder: Secondary | ICD-10-CM | POA: Diagnosis not present

## 2018-11-18 DIAGNOSIS — F17210 Nicotine dependence, cigarettes, uncomplicated: Secondary | ICD-10-CM | POA: Diagnosis not present

## 2018-11-18 DIAGNOSIS — I517 Cardiomegaly: Secondary | ICD-10-CM | POA: Diagnosis not present

## 2018-11-18 DIAGNOSIS — E871 Hypo-osmolality and hyponatremia: Secondary | ICD-10-CM | POA: Diagnosis not present

## 2018-11-18 DIAGNOSIS — E119 Type 2 diabetes mellitus without complications: Secondary | ICD-10-CM | POA: Diagnosis not present

## 2018-11-18 DIAGNOSIS — I248 Other forms of acute ischemic heart disease: Secondary | ICD-10-CM | POA: Diagnosis not present

## 2018-11-18 DIAGNOSIS — R4789 Other speech disturbances: Secondary | ICD-10-CM | POA: Diagnosis not present

## 2018-11-18 DIAGNOSIS — R7989 Other specified abnormal findings of blood chemistry: Secondary | ICD-10-CM | POA: Diagnosis not present

## 2018-11-18 DIAGNOSIS — E079 Disorder of thyroid, unspecified: Secondary | ICD-10-CM | POA: Diagnosis not present

## 2018-11-18 DIAGNOSIS — F22 Delusional disorders: Secondary | ICD-10-CM | POA: Diagnosis not present

## 2018-11-18 DIAGNOSIS — J438 Other emphysema: Secondary | ICD-10-CM | POA: Diagnosis not present

## 2018-11-18 DIAGNOSIS — Z79899 Other long term (current) drug therapy: Secondary | ICD-10-CM | POA: Diagnosis not present

## 2018-11-18 DIAGNOSIS — F1010 Alcohol abuse, uncomplicated: Secondary | ICD-10-CM | POA: Diagnosis not present

## 2018-11-18 DIAGNOSIS — E11649 Type 2 diabetes mellitus with hypoglycemia without coma: Secondary | ICD-10-CM | POA: Diagnosis not present

## 2018-11-18 DIAGNOSIS — E1110 Type 2 diabetes mellitus with ketoacidosis without coma: Secondary | ICD-10-CM | POA: Diagnosis not present

## 2018-11-18 DIAGNOSIS — R9431 Abnormal electrocardiogram [ECG] [EKG]: Secondary | ICD-10-CM | POA: Diagnosis not present

## 2018-11-18 DIAGNOSIS — E785 Hyperlipidemia, unspecified: Secondary | ICD-10-CM | POA: Diagnosis not present

## 2018-11-18 DIAGNOSIS — E1136 Type 2 diabetes mellitus with diabetic cataract: Secondary | ICD-10-CM | POA: Diagnosis not present

## 2018-11-18 DIAGNOSIS — Z7902 Long term (current) use of antithrombotics/antiplatelets: Secondary | ICD-10-CM | POA: Diagnosis not present

## 2018-11-18 DIAGNOSIS — Z7984 Long term (current) use of oral hypoglycemic drugs: Secondary | ICD-10-CM | POA: Diagnosis not present

## 2018-11-18 DIAGNOSIS — Z794 Long term (current) use of insulin: Secondary | ICD-10-CM | POA: Diagnosis not present

## 2018-11-18 DIAGNOSIS — F200 Paranoid schizophrenia: Secondary | ICD-10-CM | POA: Diagnosis not present

## 2018-11-18 DIAGNOSIS — R4689 Other symptoms and signs involving appearance and behavior: Secondary | ICD-10-CM | POA: Diagnosis not present

## 2018-12-18 ENCOUNTER — Encounter: Payer: Self-pay | Admitting: *Deleted

## 2018-12-25 ENCOUNTER — Telehealth: Payer: Self-pay | Admitting: Nurse Practitioner

## 2018-12-25 NOTE — Telephone Encounter (Signed)
Called and could not leave vm for patient. Calling to schedule virtual visit with Nche

## 2019-01-14 ENCOUNTER — Ambulatory Visit: Payer: PPO

## 2019-01-14 ENCOUNTER — Telehealth (INDEPENDENT_AMBULATORY_CARE_PROVIDER_SITE_OTHER): Payer: PPO | Admitting: Registered Nurse

## 2019-01-14 ENCOUNTER — Encounter: Payer: Self-pay | Admitting: Registered Nurse

## 2019-01-14 ENCOUNTER — Other Ambulatory Visit: Payer: Self-pay

## 2019-01-14 DIAGNOSIS — Z794 Long term (current) use of insulin: Secondary | ICD-10-CM | POA: Diagnosis not present

## 2019-01-14 DIAGNOSIS — E785 Hyperlipidemia, unspecified: Secondary | ICD-10-CM | POA: Diagnosis not present

## 2019-01-14 DIAGNOSIS — E11649 Type 2 diabetes mellitus with hypoglycemia without coma: Secondary | ICD-10-CM

## 2019-01-14 DIAGNOSIS — I10 Essential (primary) hypertension: Secondary | ICD-10-CM

## 2019-01-14 DIAGNOSIS — R410 Disorientation, unspecified: Secondary | ICD-10-CM

## 2019-01-14 NOTE — Progress Notes (Signed)
Spoke with pt and she states she needs to [facility] wit new PCP to manage her Chronic Conditions. She needs medication consultation and refills on medication at this time.
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
## 2019-01-15 LAB — URINALYSIS
Bilirubin, UA: NEGATIVE
Ketones, UA: NEGATIVE
Leukocytes,UA: NEGATIVE
Nitrite, UA: POSITIVE — AB
RBC, UA: NEGATIVE
Specific Gravity, UA: 1.024 (ref 1.005–1.030)
Urobilinogen, Ur: 0.2 mg/dL (ref 0.2–1.0)
pH, UA: 5.5 (ref 5.0–7.5)

## 2019-01-15 LAB — LIPID PANEL
Chol/HDL Ratio: 3.1 ratio (ref 0.0–4.4)
Cholesterol, Total: 154 mg/dL (ref 100–199)
HDL: 50 mg/dL (ref >39)
LDL Calculated: 39 mg/dL (ref 0–99)
Triglycerides: 326 mg/dL — ABNORMAL HIGH (ref 0–149)
VLDL Cholesterol Cal: 65 mg/dL — ABNORMAL HIGH (ref 5–40)

## 2019-01-15 LAB — COMPREHENSIVE METABOLIC PANEL
ALT: 13 IU/L (ref 0–32)
AST: 13 IU/L (ref 0–40)
Albumin/Globulin Ratio: 1.7 (ref 1.2–2.2)
Albumin: 4.2 g/dL (ref 3.8–4.8)
Alkaline Phosphatase: 114 IU/L (ref 39–117)
BUN/Creatinine Ratio: 27 (ref 12–28)
BUN: 23 mg/dL (ref 8–27)
Bilirubin Total: <0.2 mg/dL (ref 0.0–1.2)
CO2: 22 mmol/L (ref 20–29)
Calcium: 9.4 mg/dL (ref 8.7–10.3)
Chloride: 102 mmol/L (ref 96–106)
Creatinine, Ser: 0.85 mg/dL (ref 0.57–1.00)
GFR calc Af Amer: 80 mL/min/{1.73_m2} (ref >59)
GFR calc non Af Amer: 70 mL/min/{1.73_m2} (ref >59)
Globulin, Total: 2.5 g/dL (ref 1.5–4.5)
Glucose: 495 mg/dL — ABNORMAL HIGH (ref 65–99)
Potassium: 4 mmol/L (ref 3.5–5.2)
Sodium: 140 mmol/L (ref 134–144)
Total Protein: 6.7 g/dL (ref 6.0–8.5)

## 2019-01-15 LAB — CBC WITH DIFFERENTIAL/PLATELET
Basophils Absolute: 0.1 10*3/uL (ref 0.0–0.2)
Basos: 1 %
EOS (ABSOLUTE): 0.2 10*3/uL (ref 0.0–0.4)
Eos: 2 %
Hematocrit: 36.5 % (ref 34.0–46.6)
Hemoglobin: 12.2 g/dL (ref 11.1–15.9)
Immature Grans (Abs): 0 10*3/uL (ref 0.0–0.1)
Immature Granulocytes: 0 %
Lymphocytes Absolute: 2.3 10*3/uL (ref 0.7–3.1)
Lymphs: 29 %
MCH: 32.1 pg (ref 26.6–33.0)
MCHC: 33.4 g/dL (ref 31.5–35.7)
MCV: 96 fL (ref 79–97)
Monocytes Absolute: 0.6 10*3/uL (ref 0.1–0.9)
Monocytes: 8 %
Neutrophils Absolute: 4.8 10*3/uL (ref 1.4–7.0)
Neutrophils: 60 %
Platelets: 309 10*3/uL (ref 150–450)
RBC: 3.8 x10E6/uL (ref 3.77–5.28)
RDW: 12.1 % (ref 11.7–15.4)
WBC: 8 10*3/uL (ref 3.4–10.8)

## 2019-01-15 LAB — HEMOGLOBIN A1C
Est. average glucose Bld gHb Est-mCnc: 226 mg/dL
Hgb A1c MFr Bld: 9.5 % — ABNORMAL HIGH (ref 4.8–5.6)

## 2019-01-16 LAB — URINE CULTURE

## 2019-01-17 ENCOUNTER — Telehealth: Payer: Self-pay | Admitting: Registered Nurse

## 2019-01-17 ENCOUNTER — Other Ambulatory Visit: Payer: Self-pay | Admitting: Registered Nurse

## 2019-01-17 DIAGNOSIS — N3000 Acute cystitis without hematuria: Secondary | ICD-10-CM

## 2019-01-17 MED ORDER — AMOXICILLIN-POT CLAVULANATE 875-125 MG PO TABS: 1.0000 | ORAL_TABLET | Freq: Two times a day (BID) | ORAL | 0 refills | Status: AC

## 2019-01-17 NOTE — Telephone Encounter (Signed)
I attempted to contact Ms. Guertin today regarding her test results. My most urgent concern is for her current UTI. No answer, left vm asking her to return the call to the clinic. I will attempt to contact her daughter, Lexine Baton, to discuss these results.   Kathrin Ruddy, NP

## 2019-01-17 NOTE — Progress Notes (Signed)
Was able to contact pt's daughter, Lexine Baton. Discussed lab results and sent medication   augmentin 875-125mg  PO bid for 7 days.   We discussed the patient's history of noncompliance and the importance of finishing the course of this medication.   Kathrin Ruddy, NP

## 2019-01-20 ENCOUNTER — Telehealth: Payer: Self-pay | Admitting: Registered Nurse

## 2019-01-20 DIAGNOSIS — Z794 Long term (current) use of insulin: Secondary | ICD-10-CM

## 2019-01-20 DIAGNOSIS — E11649 Type 2 diabetes mellitus with hypoglycemia without coma: Secondary | ICD-10-CM

## 2019-01-20 NOTE — Telephone Encounter (Signed)
Pt returned call to clinic to discuss lab results. Informed her of the UTI, she states she will take the medication when her daughter can pick it up for her from the pharmacy.  At this time, she also requests referrals to be sent within [facility] system including: optometry, neurology, and podiatry. These were all in place when she was being seen by [facility] in [facility]. Those have been filled today.  I attempted to discuss further lab results with patient unsuccessfully. She again is distracted and has an unclear thought pattern. When attempting to discuss her referral to psych, she was adamant that she does not need to go, despite a history of paranoid schizophrenia in her chart. At this time, she begin to say something about taking legal action. I again confirmed she would take the antibiotics for her UTI and wished her a pleasant afternoon.  Kathrin Ruddy, NP

## 2019-01-22 ENCOUNTER — Telehealth: Payer: Self-pay | Admitting: Registered Nurse

## 2019-01-22 NOTE — Telephone Encounter (Signed)
Copied from [place] [id number]. Topic: Quick Communication - See Telephone Encounter >> Jan 22, 2019  5:10 PM Nils Flack, Marland Kitchen wrote: CRM for notification. See Telephone encounter for: 01/22/19. Pt is asking for call back tomorrow afternoon, wheni asked about the subject matter she said her health.  Please call  Thanks

## 2019-01-24 DIAGNOSIS — R079 Chest pain, unspecified: Secondary | ICD-10-CM | POA: Diagnosis not present

## 2019-01-24 DIAGNOSIS — Z794 Long term (current) use of insulin: Secondary | ICD-10-CM | POA: Diagnosis not present

## 2019-01-24 DIAGNOSIS — E876 Hypokalemia: Secondary | ICD-10-CM | POA: Diagnosis not present

## 2019-01-24 DIAGNOSIS — I509 Heart failure, unspecified: Secondary | ICD-10-CM | POA: Diagnosis not present

## 2019-01-24 DIAGNOSIS — I498 Other specified cardiac arrhythmias: Secondary | ICD-10-CM | POA: Diagnosis not present

## 2019-01-24 DIAGNOSIS — E119 Type 2 diabetes mellitus without complications: Secondary | ICD-10-CM | POA: Diagnosis not present

## 2019-01-24 DIAGNOSIS — Z5329 Procedure and treatment not carried out because of patient's decision for other reasons: Secondary | ICD-10-CM | POA: Diagnosis not present

## 2019-01-24 DIAGNOSIS — I110 Hypertensive heart disease with heart failure: Secondary | ICD-10-CM | POA: Diagnosis not present

## 2019-01-24 DIAGNOSIS — I700 Atherosclerosis of aorta: Secondary | ICD-10-CM | POA: Diagnosis not present

## 2019-01-24 DIAGNOSIS — E1165 Type 2 diabetes mellitus with hyperglycemia: Secondary | ICD-10-CM | POA: Diagnosis not present

## 2019-01-24 DIAGNOSIS — R9431 Abnormal electrocardiogram [ECG] [EKG]: Secondary | ICD-10-CM | POA: Diagnosis not present

## 2019-01-24 DIAGNOSIS — R600 Localized edema: Secondary | ICD-10-CM | POA: Diagnosis not present

## 2019-01-24 DIAGNOSIS — I252 Old myocardial infarction: Secondary | ICD-10-CM | POA: Diagnosis not present

## 2019-01-24 DIAGNOSIS — F17210 Nicotine dependence, cigarettes, uncomplicated: Secondary | ICD-10-CM | POA: Diagnosis not present

## 2019-01-24 DIAGNOSIS — R7989 Other specified abnormal findings of blood chemistry: Secondary | ICD-10-CM | POA: Diagnosis not present

## 2019-01-24 DIAGNOSIS — M7989 Other specified soft tissue disorders: Secondary | ICD-10-CM | POA: Diagnosis not present

## 2019-01-24 DIAGNOSIS — F209 Schizophrenia, unspecified: Secondary | ICD-10-CM | POA: Diagnosis not present

## 2019-01-24 DIAGNOSIS — I10 Essential (primary) hypertension: Secondary | ICD-10-CM | POA: Diagnosis not present

## 2019-01-31 ENCOUNTER — Other Ambulatory Visit: Payer: Self-pay | Admitting: *Deleted

## 2019-01-31 NOTE — Patient Outreach (Addendum)
[facility]) Care Management  01/31/2019  [name] 1951-07-28 [id number]   Follow up from HRA screening call attempt on 4/6 and 4/8, unsuccessful. Patient was mailed unsuccessful outreach letter on 4/8.   Patient will be followed in [facility], Health risk assessment engaged program.     Plan Will schedule call attempt within the next month.    [name], RN, [place] Management Coordinator  [phone]- Mobile [phone]- Toll Free Main Office

## 2019-02-04 NOTE — Telephone Encounter (Signed)
Patient is calling in wanting a call back. Call back [phone].

## 2019-02-05 ENCOUNTER — Telehealth: Payer: Self-pay | Admitting: Registered Nurse

## 2019-02-05 NOTE — Telephone Encounter (Signed)
Pt called to wanting to schedule an appointment with Dr Orland Mustard to talk about her health. She kept repeating things that was not part of the conversation. When asked if she was having pain, she stated she has pain all the time and that is why she needed the appointment. She denies having family.  She stated her health was not perfect.  And would like to have this appointment on June 3rd, a Wednesday after 3 because that when she will be  in [place].  Her speech was not very clear. I told her that I would pass the message on and I was not able to schedule her appointment. She voiced ok and to make sure that someone calls her in the afternoon. She does not get up until late.

## 2019-02-05 NOTE — Telephone Encounter (Signed)
FYI: I called pt and stated  that I received a message that she want our office to call her. Pt stated yes she would like to talk with the doctor. I asked to pt what are her concerns maybe I can help you. Pt start talking but I unable to understand what she was saying. I stated that we must have a bad connection. Then the phone hung up. I called pt back and apologize for the connection being bad  and pt hung up the phone.

## 2019-02-05 NOTE — Telephone Encounter (Signed)
I have spoken with the patient since the last time I called her - perhaps she was referring to an earlier voicemail. Regardless, I do not need to speak with her at this time. Thank you

## 2019-02-06 NOTE — Telephone Encounter (Signed)
Called pt to see if we could schedule appt  Or help phone just rings na . Pt does have a appt already at 3:00 June 3rd with [facility] set up by Orland Mustard

## 2019-02-09 DIAGNOSIS — B9620 Unspecified Escherichia coli [E. coli] as the cause of diseases classified elsewhere: Secondary | ICD-10-CM | POA: Diagnosis not present

## 2019-02-09 DIAGNOSIS — E1165 Type 2 diabetes mellitus with hyperglycemia: Secondary | ICD-10-CM | POA: Diagnosis not present

## 2019-02-09 DIAGNOSIS — Z9114 Patient's other noncompliance with medication regimen: Secondary | ICD-10-CM | POA: Diagnosis not present

## 2019-02-09 DIAGNOSIS — Z1159 Encounter for screening for other viral diseases: Secondary | ICD-10-CM | POA: Diagnosis not present

## 2019-02-09 DIAGNOSIS — J438 Other emphysema: Secondary | ICD-10-CM | POA: Diagnosis not present

## 2019-02-09 DIAGNOSIS — R600 Localized edema: Secondary | ICD-10-CM | POA: Diagnosis not present

## 2019-02-09 DIAGNOSIS — N3000 Acute cystitis without hematuria: Secondary | ICD-10-CM | POA: Diagnosis not present

## 2019-02-09 DIAGNOSIS — F29 Unspecified psychosis not due to a substance or known physiological condition: Secondary | ICD-10-CM | POA: Diagnosis not present

## 2019-02-09 DIAGNOSIS — Z1623 Resistance to quinolones and fluoroquinolones: Secondary | ICD-10-CM | POA: Diagnosis not present

## 2019-02-09 DIAGNOSIS — Z881 Allergy status to other antibiotic agents status: Secondary | ICD-10-CM | POA: Diagnosis not present

## 2019-02-09 DIAGNOSIS — G40909 Epilepsy, unspecified, not intractable, without status epilepticus: Secondary | ICD-10-CM | POA: Diagnosis not present

## 2019-02-09 DIAGNOSIS — G319 Degenerative disease of nervous system, unspecified: Secondary | ICD-10-CM | POA: Diagnosis not present

## 2019-02-09 DIAGNOSIS — Z91018 Allergy to other foods: Secondary | ICD-10-CM | POA: Diagnosis not present

## 2019-02-09 DIAGNOSIS — I10 Essential (primary) hypertension: Secondary | ICD-10-CM | POA: Diagnosis not present

## 2019-02-09 DIAGNOSIS — Z79899 Other long term (current) drug therapy: Secondary | ICD-10-CM | POA: Diagnosis not present

## 2019-02-09 DIAGNOSIS — R2243 Localized swelling, mass and lump, lower limb, bilateral: Secondary | ICD-10-CM | POA: Diagnosis not present

## 2019-02-09 DIAGNOSIS — Z20828 Contact with and (suspected) exposure to other viral communicable diseases: Secondary | ICD-10-CM | POA: Diagnosis not present

## 2019-02-09 DIAGNOSIS — K219 Gastro-esophageal reflux disease without esophagitis: Secondary | ICD-10-CM | POA: Diagnosis not present

## 2019-02-09 DIAGNOSIS — R4182 Altered mental status, unspecified: Secondary | ICD-10-CM | POA: Diagnosis not present

## 2019-02-09 DIAGNOSIS — J45909 Unspecified asthma, uncomplicated: Secondary | ICD-10-CM | POA: Diagnosis not present

## 2019-02-09 DIAGNOSIS — Z8673 Personal history of transient ischemic attack (TIA), and cerebral infarction without residual deficits: Secondary | ICD-10-CM | POA: Diagnosis not present

## 2019-02-09 DIAGNOSIS — E119 Type 2 diabetes mellitus without complications: Secondary | ICD-10-CM | POA: Diagnosis not present

## 2019-02-09 DIAGNOSIS — Z888 Allergy status to other drugs, medicaments and biological substances status: Secondary | ICD-10-CM | POA: Diagnosis not present

## 2019-02-09 DIAGNOSIS — Z794 Long term (current) use of insulin: Secondary | ICD-10-CM | POA: Diagnosis not present

## 2019-02-09 DIAGNOSIS — E1142 Type 2 diabetes mellitus with diabetic polyneuropathy: Secondary | ICD-10-CM | POA: Diagnosis not present

## 2019-02-09 DIAGNOSIS — F17200 Nicotine dependence, unspecified, uncomplicated: Secondary | ICD-10-CM | POA: Diagnosis not present

## 2019-02-09 DIAGNOSIS — F201 Disorganized schizophrenia: Secondary | ICD-10-CM | POA: Diagnosis not present

## 2019-02-09 DIAGNOSIS — F203 Undifferentiated schizophrenia: Secondary | ICD-10-CM | POA: Diagnosis not present

## 2019-02-12 ENCOUNTER — Ambulatory Visit: Payer: Medicare HMO | Admitting: Internal Medicine

## 2019-02-12 NOTE — Progress Notes (Deleted)
Name: [name]  MRN/ DOB: [id number], 1950-09-21   Age/ Sex: 68 y.o., female    PCP: Maximiano Coss, NP   Reason for Endocrinology Evaluation: Type {NUMBERS 1 OR 2:522190} Diabetes Mellitus     Date of Initial Endocrinology Visit: 02/12/2019     PATIENT IDENTIFIER: [name] is a 68 y.o. female with a past medical history of ***. The patient presented for initial endocrinology clinic visit on 02/12/2019 for consultative assistance with her diabetes management.    HPI: [name] was    Diagnosed with DM *** Prior Medications tried/Intolerance: *** Currently checking blood sugars *** x / day,  before breakfast and ***.  Hypoglycemia episodes : ***               Symptoms: ***                 Frequency: ***/  Hemoglobin A1c has ranged from *** in ***, peaking at *** in ***. Patient required assistance for hypoglycemia:  Patient has required hospitalization within the last 1 year from hyper or hypoglycemia:   In terms of diet, the patient ***   HOME DIABETES REGIMEN: Basal: ***  Bolus: ***   Statin: {Yes/No:11203} ACE-I/ARB: {YES/NO:17245} Prior Diabetic Education: {Yes/No:11203}   METER DOWNLOAD SUMMARY: Date range evaluated: *** Fingerstick Blood Glucose Tests = *** Average Number Tests/Day = *** Overall Mean FS Glucose = *** Standard Deviation = ***  BG Ranges: Low = *** High = ***   Hypoglycemic Events/30 Days: BG < 50 = *** Episodes of symptomatic severe hypoglycemia = ***   DIABETIC COMPLICATIONS: Microvascular complications:   ***  Denies: ***  Last eye exam: Completed   Macrovascular complications:   ***  Denies: CAD, PVD, CVA   PAST HISTORY: Past Medical History:  Past Medical History:  Diagnosis Date  . Allergy   . Anxiety   . Arthritis   . Asthma   . Cataract   . Depression   . Diabetes mellitus    type II  . GERD (gastroesophageal reflux disease)   . Hyperlipidemia   . Hypertension   . Neuromuscular disorder  ([place])   . Seizures ([place])    last seizure Jan 2016    Past Surgical History:  Past Surgical History:  Procedure Laterality Date  . APPENDECTOMY    . CYSTECTOMY    . FOOT SURGERY Bilateral 2001   x3  . SALPINGOOPHORECTOMY Left   . TUBAL LIGATION        Social History:  reports that she has been smoking cigarettes. She has been smoking about 1.00 pack per day. She has never used smokeless tobacco. She reports current alcohol use. She reports that she does not use drugs. Family History:  Family History  Problem Relation Age of Onset  . Heart disease Mother   . Diabetes Mother   . Depression Mother   . Cancer Father   . Diabetes Father   . Depression Father   . Colon cancer Father   . Diabetes Sister   . Kidney disease Brother   . Diabetes Brother   . Kidney disease Brother   . Diabetes Brother   . Diabetes Brother   . Diabetes Brother   . Colon cancer Brother   . Esophageal cancer Neg Hx   . Rectal cancer Neg Hx   . Stomach cancer Neg Hx   . Pancreatic cancer Neg Hx       HOME MEDICATIONS: Allergies as of 02/12/2019  Reactions   Aspirin Anaphylaxis   Swelling in mouth, throat and hives   Bupropion    Pt reports "skin burning" sensation.    Other Itching, Swelling   Antibiotic (name unknown).  Occurred three years ago with prescription antibiotic.   Chocolate Hives, Rash      Medication List       Accurate as of February 12, 2019 10:41 AM. If you have any questions, ask your nurse or doctor.        Accu-Chek Aviva Plus w/Device Kit USE   TO CHECK GLUCOSE TWICE DAILY   accu-chek softclix lancets Use to test blood glucose 3 times daily. Dx: E11.49, insulin dependent   amoxicillin-clavulanate 875-125 MG tablet Commonly known as:  AUGMENTIN Take 1 tablet by mouth 2 (two) times daily.   glucose blood test strip Commonly known as:  Accu-Chek Guide Use to check blood sugar 3 times a day. Dx code E11.9   Lantus SoloStar 100 UNIT/ML Solostar Pen Generic  drug:  Insulin Glargine INJECT 20 UNITS into SKIN DAILY   metFORMIN 500 MG tablet Commonly known as:  GLUCOPHAGE Take 1 tablet (500 mg total) by mouth daily with breakfast. What changed:  when to take this   NON FORMULARY Urinary health cranberry   NON FORMULARY Advance Formula Eyedrops.        ALLERGIES: Allergies  Allergen Reactions  . Aspirin Anaphylaxis    Swelling in mouth, throat and hives  . Bupropion     Pt reports "skin burning" sensation.   . Other Itching and Swelling    Antibiotic (name unknown).  Occurred three years ago with prescription antibiotic.  Marland Kitchen Chocolate Hives and Rash     REVIEW OF SYSTEMS: A comprehensive ROS was conducted with the patient and is negative except as per HPI and below:  ROS    OBJECTIVE:   VITAL SIGNS: There were no vitals taken for this visit.   PHYSICAL EXAM:  General: Pt appears well and is in NAD  Hydration: Well-hydrated with moist mucous membranes and good skin turgor  HEENT: Head: Unremarkable with good dentition. Oropharynx clear without exudate.  Eyes: External eye exam normal without stare, lid lag or exophthalmos.  EOM intact.  PERRL.  Neck: General: Supple without adenopathy or carotid bruits. Thyroid: Thyroid size normal.  No goiter or nodules appreciated. No thyroid bruit.  Lungs: Clear with good BS bilat with no rales, rhonchi, or wheezes  Heart: RRR with normal S1 and S2 and no gallops; no murmurs; no rub  Abdomen: Normoactive bowel sounds, soft, nontender, without masses or organomegaly palpable  Extremities:  Lower extremities - No pretibial edema. No lesions.  Skin: Normal texture and temperature to palpation. No rash noted. No Acanthosis nigricans/skin tags. No lipohypertrophy.  Neuro: MS is good with appropriate affect, pt is alert and Ox3    DM foot exam:    DATA REVIEWED:  Lab Results  Component Value Date   HGBA1C 9.5 (H) 01/14/2019   HGBA1C 6.6 (H) 07/31/2017   HGBA1C 7.2 01/30/2017    Lab Results  Component Value Date   MICROALBUR 29.9 (H) 07/31/2017   LDLCALC 39 01/14/2019   CREATININE 0.85 01/14/2019   Lab Results  Component Value Date   MICRALBCREAT 14.3 07/31/2017    Lab Results  Component Value Date   CHOL 154 01/14/2019   HDL 50 01/14/2019   LDLCALC 39 01/14/2019   TRIG 326 (H) 01/14/2019   CHOLHDL 3.1 01/14/2019        ASSESSMENT /  PLAN / RECOMMENDATIONS:   1) Type *** Diabetes Mellitus, ***controlled, With*** complications - Most recent A1c of *** %. Goal A1c < *** %.  ***  Plan: GENERAL:  ***  MEDICATIONS:  ***  EDUCATION / INSTRUCTIONS:  BG monitoring instructions: Patient is instructed to check her blood sugars *** times a day, ***.  Call [facility] if: BG persistently < 70 or > 300. . I reviewed the Rule of 15 for the treatment of hypoglycemia in detail with the patient. Literature supplied.   2) Diabetic complications:   Eye: Does *** have known diabetic retinopathy.   Neuro/ Feet: Does *** have known diabetic peripheral neuropathy.  Renal: Patient does *** have known baseline CKD. She is *** on an ACEI/ARB at present.Check urine albumin/creatinine ratio yearly starting at time of diagnosis. If albuminuria is positive, treatment is geared toward better glucose, blood pressure control and use of ACE inhibitors or ARBs. Monitor electrolytes and creatinine once to twice yearly.   3) Lipids: Patient is *** on a statin.    4) Hypertension: ***  at goal of < 140/90 mmHg.       Signed electronically by: Mack Guise, MD  [facility]  [facility] [address], [place] [address] Phone: [phone] FAX: [phone]   CC: Maximiano Coss, NP [place] [address] Phone: [phone]  Fax: [phone]    Return to Endocrinology clinic as below: Future Appointments  Date Time Provider [place]  02/12/2019  3:00 PM  Deklan Minar, Melanie Crazier, MD [facility] None  02/14/2019 10:15 AM Trula Slade, DPM [facility]  02/20/2019  1:00 PM Alfonzo Feller, RN [facility] None

## 2019-02-14 ENCOUNTER — Ambulatory Visit: Payer: PPO | Admitting: Podiatry

## 2019-02-20 ENCOUNTER — Ambulatory Visit: Payer: Medicare HMO | Admitting: *Deleted

## 2019-02-26 ENCOUNTER — Other Ambulatory Visit: Payer: Self-pay | Admitting: *Deleted

## 2019-02-26 NOTE — Patient Outreach (Signed)
[facility]) Care Management  02/26/2019  [name] [birth year] [id number]   Patient  case has been transitioned to [facility] to continue Care Management services.    [name], RN, [place] Management Coordinator  [phone]- Mobile [phone]- Toll Free Main Office

## 2019-02-27 ENCOUNTER — Ambulatory Visit: Payer: PPO | Admitting: Registered Nurse

## 2019-02-28 ENCOUNTER — Encounter: Payer: Self-pay | Admitting: Registered Nurse

## 2019-02-28 ENCOUNTER — Ambulatory Visit: Payer: PPO | Admitting: Podiatry

## 2019-03-03 ENCOUNTER — Ambulatory Visit: Payer: PPO | Admitting: Podiatry

## 2019-03-13 DIAGNOSIS — F29 Unspecified psychosis not due to a substance or known physiological condition: Secondary | ICD-10-CM | POA: Diagnosis not present

## 2019-03-19 ENCOUNTER — Ambulatory Visit: Payer: Medicare HMO | Admitting: *Deleted

## 2019-04-07 ENCOUNTER — Telehealth: Payer: Self-pay | Admitting: Registered Nurse

## 2019-04-07 NOTE — Telephone Encounter (Signed)
Pt called to have no show fee waived. Pt was in hospital and can provide proof. Attempted to call pt 2x but phone number is out of service.  No show fee can be waived.

## 2019-04-12 DIAGNOSIS — Z794 Long term (current) use of insulin: Secondary | ICD-10-CM | POA: Diagnosis not present

## 2019-04-12 DIAGNOSIS — E1100 Type 2 diabetes mellitus with hyperosmolarity without nonketotic hyperglycemic-hyperosmolar coma (NKHHC): Secondary | ICD-10-CM | POA: Diagnosis not present

## 2019-04-12 DIAGNOSIS — Z8673 Personal history of transient ischemic attack (TIA), and cerebral infarction without residual deficits: Secondary | ICD-10-CM | POA: Diagnosis not present

## 2019-04-12 DIAGNOSIS — M1990 Unspecified osteoarthritis, unspecified site: Secondary | ICD-10-CM | POA: Diagnosis not present

## 2019-04-12 DIAGNOSIS — Z886 Allergy status to analgesic agent status: Secondary | ICD-10-CM | POA: Diagnosis not present

## 2019-04-12 DIAGNOSIS — Z91138 Patient's unintentional underdosing of medication regimen for other reason: Secondary | ICD-10-CM | POA: Diagnosis not present

## 2019-04-12 DIAGNOSIS — R531 Weakness: Secondary | ICD-10-CM | POA: Diagnosis not present

## 2019-04-12 DIAGNOSIS — I10 Essential (primary) hypertension: Secondary | ICD-10-CM | POA: Diagnosis not present

## 2019-04-12 DIAGNOSIS — T383X6A Underdosing of insulin and oral hypoglycemic [antidiabetic] drugs, initial encounter: Secondary | ICD-10-CM | POA: Diagnosis not present

## 2019-04-12 DIAGNOSIS — K219 Gastro-esophageal reflux disease without esophagitis: Secondary | ICD-10-CM | POA: Diagnosis not present

## 2019-04-12 DIAGNOSIS — R410 Disorientation, unspecified: Secondary | ICD-10-CM | POA: Diagnosis not present

## 2019-04-12 DIAGNOSIS — Z79899 Other long term (current) drug therapy: Secondary | ICD-10-CM | POA: Diagnosis not present

## 2019-04-12 DIAGNOSIS — F203 Undifferentiated schizophrenia: Secondary | ICD-10-CM | POA: Diagnosis not present

## 2019-04-12 DIAGNOSIS — J45909 Unspecified asthma, uncomplicated: Secondary | ICD-10-CM | POA: Diagnosis not present

## 2019-04-12 DIAGNOSIS — Z881 Allergy status to other antibiotic agents status: Secondary | ICD-10-CM | POA: Diagnosis not present

## 2019-04-12 DIAGNOSIS — E785 Hyperlipidemia, unspecified: Secondary | ICD-10-CM | POA: Diagnosis not present

## 2019-04-12 DIAGNOSIS — F17210 Nicotine dependence, cigarettes, uncomplicated: Secondary | ICD-10-CM | POA: Diagnosis not present

## 2019-04-12 DIAGNOSIS — Z888 Allergy status to other drugs, medicaments and biological substances status: Secondary | ICD-10-CM | POA: Diagnosis not present

## 2019-04-13 DIAGNOSIS — I10 Essential (primary) hypertension: Secondary | ICD-10-CM | POA: Diagnosis not present

## 2019-04-13 DIAGNOSIS — E1100 Type 2 diabetes mellitus with hyperosmolarity without nonketotic hyperglycemic-hyperosmolar coma (NKHHC): Secondary | ICD-10-CM | POA: Diagnosis not present

## 2019-04-14 DIAGNOSIS — E1100 Type 2 diabetes mellitus with hyperosmolarity without nonketotic hyperglycemic-hyperosmolar coma (NKHHC): Secondary | ICD-10-CM | POA: Diagnosis not present

## 2019-04-14 DIAGNOSIS — I10 Essential (primary) hypertension: Secondary | ICD-10-CM | POA: Diagnosis not present

## 2019-04-24 ENCOUNTER — Telehealth: Payer: Self-pay | Admitting: Family Medicine
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
## 2019-05-06 ENCOUNTER — Telehealth: Payer: Self-pay | Admitting: Registered Nurse

## 2019-05-06 NOTE — Telephone Encounter (Signed)
Funeral home rep dropped off death certificate. Certificate left w/ Chanda/Sierra at nurses station

## 2019-05-13 DEATH — deceased

## 2019-05-14 ENCOUNTER — Telehealth: Payer: Self-pay | Admitting: Family Medicine

## 2019-05-14 NOTE — Telephone Encounter (Signed)
Placed death certificate in [place] box. Dr. Carlota Raspberry has to sign off on it since Dr. Nolon Rod is out of the office.
